# Patient Record
Sex: Female | Born: 1975 | Race: Black or African American | Hispanic: No | Marital: Single | Smoking: Former smoker
Health system: Southern US, Community
[De-identification: ages and names within clinical notes are randomized; demographics above are authoritative.]

## PROBLEM LIST (undated history)

## (undated) DIAGNOSIS — F259 Schizoaffective disorder, unspecified: Secondary | ICD-10-CM

## (undated) DIAGNOSIS — F411 Generalized anxiety disorder: Secondary | ICD-10-CM

## (undated) DIAGNOSIS — I10 Essential (primary) hypertension: Secondary | ICD-10-CM

## (undated) HISTORY — DX: Schizoaffective disorder, unspecified: F25.9

## (undated) HISTORY — PX: KNEE SURGERY: SHX244

## (undated) HISTORY — DX: Generalized anxiety disorder: F41.1

---

## 2013-03-13 ENCOUNTER — Emergency Department (HOSPITAL_COMMUNITY)
Admission: EM | Admit: 2013-03-13 | Discharge: 2013-03-13 | Disposition: A | Discharge status: Home / Self Care | Payer: Self-pay | Attending: Emergency Medicine | Admitting: Emergency Medicine

## 2013-03-13 ENCOUNTER — Encounter (HOSPITAL_COMMUNITY): Payer: Self-pay | Admitting: *Deleted

## 2013-03-13 DIAGNOSIS — R51 Headache: Secondary | ICD-10-CM | POA: Insufficient documentation

## 2013-03-13 DIAGNOSIS — H9209 Otalgia, unspecified ear: Secondary | ICD-10-CM | POA: Insufficient documentation

## 2013-03-13 DIAGNOSIS — J329 Chronic sinusitis, unspecified: Secondary | ICD-10-CM | POA: Insufficient documentation

## 2013-03-13 DIAGNOSIS — R000 Tachycardia, unspecified: Secondary | ICD-10-CM | POA: Insufficient documentation

## 2013-03-13 DIAGNOSIS — J069 Acute upper respiratory infection, unspecified: Secondary | ICD-10-CM | POA: Insufficient documentation

## 2013-03-13 DIAGNOSIS — IMO0001 Reserved for inherently not codable concepts without codable children: Secondary | ICD-10-CM | POA: Insufficient documentation

## 2013-03-13 DIAGNOSIS — J3489 Other specified disorders of nose and nasal sinuses: Secondary | ICD-10-CM | POA: Insufficient documentation

## 2013-03-13 DIAGNOSIS — R509 Fever, unspecified: Secondary | ICD-10-CM | POA: Insufficient documentation

## 2013-03-13 MED ORDER — AMOXICILLIN 500 MG PO CAPS: 500.0000 mg | ORAL_CAPSULE | Freq: Three times a day (TID) | ORAL | Status: DC

## 2013-03-13 MED ORDER — PENICILLIN V POTASSIUM 250 MG PO TABS
500.0000 mg | ORAL_TABLET | Freq: Once | ORAL | Status: AC
  Administered 2013-03-13: 500 mg via ORAL
  Filled 2013-03-13: qty 2

## 2013-03-13 MED ORDER — PSEUDOEPHEDRINE HCL 60 MG PO TABS
60.0000 mg | ORAL_TABLET | Freq: Once | ORAL | Status: AC
  Administered 2013-03-13: 60 mg via ORAL
  Filled 2013-03-13: qty 1

## 2013-03-13 MED ORDER — PROMETHAZINE-CODEINE 6.25-10 MG/5ML PO SYRP: 5.0000 mL | ORAL_SOLUTION | ORAL | Status: DC | PRN

## 2013-03-13 MED ORDER — PSEUDOEPHEDRINE HCL 60 MG PO TABS: 60.0000 mg | ORAL_TABLET | Freq: Three times a day (TID) | ORAL | Status: DC

## 2013-03-13 MED ORDER — HYDROCOD POLST-CHLORPHEN POLST 10-8 MG/5ML PO LQCR
5.0000 mL | Freq: Once | ORAL | Status: AC
  Administered 2013-03-13: 5 mL via ORAL
  Filled 2013-03-13: qty 5

## 2013-03-13 MED ORDER — IBUPROFEN 800 MG PO TABS
800.0000 mg | ORAL_TABLET | Freq: Once | ORAL | Status: AC
  Administered 2013-03-13: 800 mg via ORAL
  Filled 2013-03-13: qty 1

## 2013-03-13 MED ORDER — ONDANSETRON HCL 4 MG PO TABS
4.0000 mg | ORAL_TABLET | Freq: Once | ORAL | Status: AC
  Administered 2013-03-13: 4 mg via ORAL
  Filled 2013-03-13: qty 1

## 2013-03-13 NOTE — ED Notes (Signed)
Pt alert & oriented x4, stable gait. Patient given discharge instructions, paperwork & prescription(s). Patient  instructed to stop at the registration desk to finish any additional paperwork. Patient verbalized understanding. Pt left department w/ no further questions. 

## 2013-03-13 NOTE — ED Provider Notes (Signed)
Medical screening examination/treatment/procedure(s) were performed by non-physician practitioner and as supervising physician I was immediately available for consultation/collaboration.    Benny Lennert, MD 03/13/13 [phone]

## 2013-03-13 NOTE — ED Provider Notes (Signed)
History     CSN: [id number]  Arrival date & time 03/13/13  2055   First MD Initiated Contact with Patient 03/13/13 2139      Chief Complaint  Patient presents with  . Cough  . Nasal Congestion  . Generalized Body Aches  . Otalgia    (Consider location/radiation/quality/duration/timing/severity/associated sxs/prior treatment) Patient is a 37 y.o. female presenting with cough and ear pain. The history is provided by the patient.  Cough Cough characteristics:  Non-productive Severity:  Moderate Onset quality:  Gradual Timing:  Intermittent Progression:  Worsening Relieved by:  Nothing Worsened by:  Nothing tried Ineffective treatments:  None tried Associated symptoms: chills, ear pain, fever, headaches, myalgias and sinus congestion   Associated symptoms: no chest pain, no eye discharge, no shortness of breath and no wheezing   Risk factors: no recent infection and no recent travel   Risk factors comment:  Sick family member Otalgia Associated symptoms: cough, fever and headaches   Associated symptoms: no abdominal pain and no neck pain     History reviewed. No pertinent past medical history.  Past Surgical History  Procedure Laterality Date  . Cesarean section      History reviewed. No pertinent family history.  History  Substance Use Topics  . Smoking status: Never Smoker   . Smokeless tobacco: Not on file  . Alcohol Use: No    OB History   Grav Para Term Preterm Abortions TAB SAB Ect Mult Living                  Review of Systems  Constitutional: Positive for fever and chills. Negative for activity change.       All ROS Neg except as noted in HPI  HENT: Positive for ear pain. Negative for nosebleeds and neck pain.   Eyes: Negative for photophobia and discharge.  Respiratory: Positive for cough. Negative for shortness of breath and wheezing.   Cardiovascular: Negative for chest pain and palpitations.  Gastrointestinal: Negative for abdominal pain and  blood in stool.  Genitourinary: Negative for dysuria, frequency and hematuria.  Musculoskeletal: Positive for myalgias. Negative for back pain and arthralgias.  Skin: Negative.   Neurological: Positive for headaches. Negative for dizziness, seizures and speech difficulty.  Psychiatric/Behavioral: Negative for hallucinations and confusion.    Allergies  Review of patient's allergies indicates no known allergies.  Home Medications   Current Outpatient Rx  Name  Route  Sig  Dispense  Refill  . amoxicillin (AMOXIL) 500 MG capsule   Oral   Take 1 capsule (500 mg total) by mouth 3 (three) times daily.   21 capsule   0   . promethazine-codeine (PHENERGAN WITH CODEINE) 6.25-10 MG/5ML syrup   Oral   Take 5 mLs by mouth every 4 (four) hours as needed for cough.   150 mL   0   . pseudoephedrine (SUDAFED) 60 MG tablet   Oral   Take 1 tablet (60 mg total) by mouth 3 (three) times daily.   30 tablet   0     BP 126/84  Pulse 105  Temp(Src) 99.9 F (37.7 C) (Oral)  Resp 20  Ht 5\' 7"  (1.702 m)  Wt 169 lb (76.658 kg)  BMI 26.46 kg/m2  SpO2 100%  LMP 02/27/2013  Physical Exam  Nursing note and vitals reviewed. Constitutional: She is oriented to person, place, and time. She appears well-developed and well-nourished.  Non-toxic appearance. She appears ill.  HENT:  Head: Normocephalic.  Right Ear: Tympanic membrane  and external ear normal.  Left Ear: Tympanic membrane and external ear normal.  Nasal congestion  Eyes: EOM and lids are normal. Pupils are equal, round, and reactive to light.  Neck: Normal range of motion. Neck supple. Carotid bruit is not present.  Cardiovascular: Regular rhythm, normal heart sounds, intact distal pulses and normal pulses.  Tachycardia present.   Pulmonary/Chest: No respiratory distress.  Course breath sounds. No focal consolidation. Symmetrical rise and fall of the chest.  Abdominal: Soft. Bowel sounds are normal. There is no tenderness. There is  no guarding.  Musculoskeletal: Normal range of motion.  Lymphadenopathy:       Head (right side): No submandibular adenopathy present.       Head (left side): No submandibular adenopathy present.    She has no cervical adenopathy.  Neurological: She is alert and oriented to person, place, and time. She has normal strength. No cranial nerve deficit or sensory deficit.  Skin: Skin is warm and dry. No rash noted.  Psychiatric: She has a normal mood and affect. Her speech is normal.    ED Course  Procedures (including critical care time)  Labs Reviewed - No data to display No results found.   1. Sinusitis   2. URI (upper respiratory infection)       MDM  I have reviewed nursing notes, vital signs, and all appropriate lab and imaging results for this patient.  Suspect URI and sinusitis. Pt treated with amoxil, promethazine cough medication. And sudafed. Temp 99 after tx with ibuprofen. Pt ambulatory without problem. Pulse ox 100% on RA. Pt to return if any changes or problem.      Kathie Dike, PA-C 03/13/13 2249

## 2013-03-13 NOTE — ED Notes (Signed)
Pt c/o cough, congestion, generalized body aches, and right ear pain.

## 2013-03-14 NOTE — ED Notes (Signed)
Pt called 6/9 requesting a work note.  Copy made and left at registration

## 2014-10-05 ENCOUNTER — Emergency Department (HOSPITAL_COMMUNITY): Payer: Medicaid Other

## 2014-10-05 ENCOUNTER — Encounter (HOSPITAL_COMMUNITY): Payer: Self-pay | Admitting: Cardiology

## 2014-10-05 ENCOUNTER — Emergency Department (HOSPITAL_COMMUNITY)
Admission: EM | Admit: 2014-10-05 | Discharge: 2014-10-05 | Disposition: A | Discharge status: Home / Self Care | Payer: Medicaid Other | Attending: Emergency Medicine | Admitting: Emergency Medicine

## 2014-10-05 DIAGNOSIS — M25562 Pain in left knee: Secondary | ICD-10-CM | POA: Diagnosis not present

## 2014-10-05 DIAGNOSIS — M79672 Pain in left foot: Secondary | ICD-10-CM | POA: Diagnosis not present

## 2014-10-05 DIAGNOSIS — M79605 Pain in left leg: Secondary | ICD-10-CM | POA: Diagnosis present

## 2014-10-05 DIAGNOSIS — Z792 Long term (current) use of antibiotics: Secondary | ICD-10-CM | POA: Insufficient documentation

## 2014-10-05 DIAGNOSIS — M79662 Pain in left lower leg: Secondary | ICD-10-CM | POA: Insufficient documentation

## 2014-10-05 DIAGNOSIS — Z791 Long term (current) use of non-steroidal anti-inflammatories (NSAID): Secondary | ICD-10-CM | POA: Diagnosis not present

## 2014-10-05 DIAGNOSIS — M79673 Pain in unspecified foot: Secondary | ICD-10-CM

## 2014-10-05 DIAGNOSIS — Z79899 Other long term (current) drug therapy: Secondary | ICD-10-CM | POA: Insufficient documentation

## 2014-10-05 DIAGNOSIS — M25569 Pain in unspecified knee: Secondary | ICD-10-CM

## 2014-10-05 DIAGNOSIS — R531 Weakness: Secondary | ICD-10-CM | POA: Insufficient documentation

## 2014-10-05 MED ORDER — IBUPROFEN 800 MG PO TABS: 800.0000 mg | ORAL_TABLET | Freq: Three times a day (TID) | ORAL | Status: DC

## 2014-10-05 MED ORDER — IBUPROFEN 800 MG PO TABS
800.0000 mg | ORAL_TABLET | Freq: Once | ORAL | Status: AC
  Administered 2014-10-05: 800 mg via ORAL
  Filled 2014-10-05: qty 1

## 2014-10-05 MED ORDER — HYDROCODONE-ACETAMINOPHEN 5-325 MG PO TABS: 2.0000 | ORAL_TABLET | ORAL | Status: DC | PRN

## 2014-10-05 MED ORDER — HYDROCODONE-ACETAMINOPHEN 5-325 MG PO TABS
1.0000 | ORAL_TABLET | Freq: Once | ORAL | Status: AC
  Administered 2014-10-05: 1 via ORAL
  Filled 2014-10-05: qty 1

## 2014-10-05 NOTE — ED Notes (Signed)
Left leg pain  since 12 mn.  Denies any injury.

## 2014-10-05 NOTE — ED Provider Notes (Signed)
CSN: [id number]     Arrival date & time 10/05/14  0705 History  This chart was scribed for Glynn Octave, MD by Abel Presto, ED Scribe. This patient was seen in room APA03/[facility]03 and the patient's care was started at 7:13 AM.    Chief Complaint  Patient presents with  . Leg Pain     Patient is a 38 y.o. female presenting with leg pain. The history is provided by the patient. No language interpreter was used.  Leg Pain Associated symptoms: no back pain and no fever     HPI Comments: HPI Comments: Paula Medina is a 38 y.o. female brought in by ambulance, who presents to the Emergency Department complaining of constant left knee pain with onset at 12:30 AM.  Pt notes she has been having this pain intermittently for a month but it worsened this morning, radiating from her knee to her foot.  Pt was sleeping at first onset.  Pt notes associated swelling, weakness, and numbness in her toes.  She denies pain in her thigh, back, or abdomen. Pt has used ibuprofen, epsom salt soak, and icing for relief. Pt denies PMHx of DM and IVDA. Pt notes she saw her PCP for the symptoms and was told she has fluid build-up in her legs. Pt denies any fall, injuries, back pain, and fever. Her PCP is Dr.Tapper.  History reviewed. No pertinent past medical history. Past Surgical History  Procedure Laterality Date  . Cesarean section     History reviewed. No pertinent family history. History  Substance Use Topics  . Smoking status: Never Smoker   . Smokeless tobacco: Not on file  . Alcohol Use: No   OB History    No data available     Review of Systems  Constitutional: Negative for fever.  Gastrointestinal: Negative for abdominal pain.  Musculoskeletal: Positive for myalgias. Negative for back pain.  Neurological: Positive for weakness and numbness.    A complete 10 system review of systems was obtained and all systems are negative except as noted in the HPI and PMH.      Allergies  Review of  patient's allergies indicates no known allergies.  Home Medications   Prior to Admission medications   Medication Sig Start Date End Date Taking? Authorizing Provider  amoxicillin (AMOXIL) 500 MG capsule Take 1 capsule (500 mg total) by mouth 3 (three) times daily. 03/13/13   Kathie Dike, PA-C  HYDROcodone-acetaminophen (NORCO/VICODIN) 5-325 MG per tablet Take 2 tablets by mouth every 4 (four) hours as needed. 10/05/14   Glynn Octave, MD  ibuprofen (ADVIL,MOTRIN) 800 MG tablet Take 1 tablet (800 mg total) by mouth 3 (three) times daily. 10/05/14   Glynn Octave, MD  promethazine-codeine (PHENERGAN WITH CODEINE) 6.25-10 MG/5ML syrup Take 5 mLs by mouth every 4 (four) hours as needed for cough. 03/13/13   Kathie Dike, PA-C  pseudoephedrine (SUDAFED) 60 MG tablet Take 1 tablet (60 mg total) by mouth 3 (three) times daily. 03/13/13   Kathie Dike, PA-C   BP 130/95 mmHg  Pulse 85  Temp(Src) 97.8 F (36.6 C) (Oral)  Resp 20  Ht 5\' 6"  (1.676 m)  Wt 180 lb (81.647 kg)  BMI 29.07 kg/m2  SpO2 100%  LMP 09/21/2014 Physical Exam  Constitutional: She is oriented to person, place, and time. She appears well-developed and well-nourished. No distress.  HENT:  Head: Normocephalic and atraumatic.  Mouth/Throat: Oropharynx is clear and moist. No oropharyngeal exudate.  Eyes: Conjunctivae and EOM are normal.  Pupils are equal, round, and reactive to light.  Neck: Normal range of motion. Neck supple.  No meningismus.  Cardiovascular: Normal rate, regular rhythm, normal heart sounds and intact distal pulses.   No murmur heard. Left knee: Intact DP and PT pulses  Pulmonary/Chest: Effort normal and breath sounds normal. No respiratory distress.  Abdominal: Soft. There is no tenderness. There is no rebound and no guarding.  Musculoskeletal: Normal range of motion. She exhibits no edema.       Left knee: She exhibits no effusion, no erythema, no LCL laxity and no MCL laxity. Tenderness (calf)  found.  flexion and extension intact  Neurological: She is alert and oriented to person, place, and time. No cranial nerve deficit. She exhibits normal muscle tone. Coordination normal.  No ataxia on finger to nose bilaterally. No pronator drift. 5/5 strength throughout. CN 2-12 intact. Negative Romberg. Equal grip strength. Sensation intact. Gait is normal.   Skin: Skin is warm.  Psychiatric: She has a normal mood and affect. Her behavior is normal.  Nursing note and vitals reviewed.   ED Course  Procedures (including critical care time) DIAGNOSTIC STUDIES: Oxygen Saturation is 100% on room air, normal by my interpretation.    COORDINATION OF CARE: 7:19 AM Discussed treatment plan with patient at beside, the patient agrees with the plan and has no further questions at this time.   Labs Review Labs Reviewed - No data to display  Imaging Review [place] Venous Img Lower Unilateral Left  10/05/2014   CLINICAL DATA:  Pain in region of left knee  EXAM: LEFT LOWER EXTREMITY VENOUS DUPLEX ULTRASOUND  TECHNIQUE: Gray-scale sonography with graded compression, as well as color Doppler and duplex ultrasound were performed to evaluate the left lower extremity deep venous system from the level of the common femoral vein and including the common femoral, femoral, profunda femoral, popliteal and calf veins including the posterior tibial, peroneal and gastrocnemius veins when visible. The superficial great saphenous vein was also interrogated. Spectral Doppler was utilized to evaluate flow at rest and with distal augmentation maneuvers in the common femoral, femoral and popliteal veins.  COMPARISON:  None.  FINDINGS: Contralateral Common Femoral Vein: Respiratory phasicity is normal and symmetric with the symptomatic side. No evidence of thrombus. Normal compressibility.  Common Femoral Vein: No evidence of thrombus. Normal compressibility, respiratory phasicity and response to augmentation.  Saphenofemoral  Junction: No evidence of thrombus. Normal compressibility and flow on color Doppler imaging.  Profunda Femoral Vein: No evidence of thrombus. Normal compressibility and flow on color Doppler imaging.  Femoral Vein: No evidence of thrombus. Normal compressibility, respiratory phasicity and response to augmentation.  Popliteal Vein: No evidence of thrombus. Normal compressibility, respiratory phasicity and response to augmentation.  Calf Veins: No evidence of thrombus. Normal compressibility and flow on color Doppler imaging.  Superficial Great Saphenous Vein: No evidence of thrombus. Normal compressibility and flow on color Doppler imaging.  Venous Reflux:  None.  Other Findings:  None.  IMPRESSION: No evidence of left lower extremity deep venous thrombosis. The right common femoral vein is also patent.   Electronically Signed   By: Bretta BangWilliam  Woodruff M.D.   On: 10/05/2014 09:04   Dg Knee Complete 4 Views Left  10/05/2014   CLINICAL DATA:  Intermittent knee pain for the past month worsening this morning.  EXAM: LEFT KNEE - COMPLETE 4+ VIEW  COMPARISON:  None.  FINDINGS: The joint spaces are maintained. Minimal lateral tibial spurring changes. No acute fracture or osteochondral abnormality. No  obvious joint effusion.  IMPRESSION: No acute bony findings.   Electronically Signed   By: Loralie ChampagneMark  Gallerani M.D.   On: 10/05/2014 07:52   Dg Foot Complete Left  10/05/2014   CLINICAL DATA:  Generalized left foot pain since last night without known injury  EXAM: LEFT FOOT - COMPLETE 3+ VIEW  COMPARISON:  None.  FINDINGS: The bones of the left foot are adequately mineralized. There are mild degenerative interphalangeal joint changes. The metatarsophalangeal joints are unremarkable. The intertarsal and tarsometatarsal joints also are unremarkable. The soft tissues exhibit no abnormal calcifications nor evidence of gas collections or significant swelling.  IMPRESSION: There is no acute bony abnormality of the left foot.    Electronically Signed   By: David  SwazilandJordan   On: 10/05/2014 09:51     EKG Interpretation None      MDM   Final diagnoses:  Knee pain  Foot pain   knee and leg pain for the past month, worse this morning. Denies injury. Flexion and extension intact neurovascularly intact  Some calf tenderness so will obtain Doppler. Knee Xray negative, Doppler negative Pain improved with meds. Able to flex and extend knee, able to ambulate. States foot hurts with weight bearing.  Xray negative as well.  Treat supportively with symptom control, follow-up with PCP.  I personally performed the services described in this documentation, which was scribed in my presence. The recorded information has been reviewed and is accurate.     Glynn OctaveStephen Ramonica Grigg, MD 10/05/14 [id number]

## 2014-10-05 NOTE — ED Notes (Signed)
Tried to ambulate pt around nurses station, pt is using a walker for assistance, but states that she cannot put pressure on her left food, because that would hurt too much in her knee. She tried to use the walker by holding on to it and hop with her right leg.

## 2014-10-05 NOTE — Discharge Instructions (Signed)
Arthralgia There is no evidence of broken bone or blood clot. Follow up with your doctor. Return to the ED if you develop new or worsening symptoms. Your caregiver has diagnosed you as suffering from an arthralgia. Arthralgia means there is pain in a joint. This can come from many reasons including:  Bruising the joint which causes soreness (inflammation) in the joint.  Wear and tear on the joints which occur as we grow older (osteoarthritis).  Overusing the joint.  Various forms of arthritis.  Infections of the joint. Regardless of the cause of pain in your joint, most of these different pains respond to anti-inflammatory drugs and rest. The exception to this is when a joint is infected, and these cases are treated with antibiotics, if it is a bacterial infection. HOME CARE INSTRUCTIONS   Rest the injured area for as long as directed by your caregiver. Then slowly start using the joint as directed by your caregiver and as the pain allows. Crutches as directed may be useful if the ankles, knees or hips are involved. If the knee was splinted or casted, continue use and care as directed. If an stretchy or elastic wrapping bandage has been applied today, it should be removed and re-applied every 3 to 4 hours. It should not be applied tightly, but firmly enough to keep swelling down. Watch toes and feet for swelling, bluish discoloration, coldness, numbness or excessive pain. If any of these problems (symptoms) occur, remove the ace bandage and re-apply more loosely. If these symptoms persist, contact your caregiver or return to this location.  For the first 24 hours, keep the injured extremity elevated on pillows while lying down.  Apply ice for 15-20 minutes to the sore joint every couple hours while awake for the first half day. Then 03-04 times per day for the first 48 hours. Put the ice in a plastic bag and place a towel between the bag of ice and your skin.  Wear any splinting, casting,  elastic bandage applications, or slings as instructed.  Only take over-the-counter or prescription medicines for pain, discomfort, or fever as directed by your caregiver. Do not use aspirin immediately after the injury unless instructed by your physician. Aspirin can cause increased bleeding and bruising of the tissues.  If you were given crutches, continue to use them as instructed and do not resume weight bearing on the sore joint until instructed. Persistent pain and inability to use the sore joint as directed for more than 2 to 3 days are warning signs indicating that you should see a caregiver for a follow-up visit as soon as possible. Initially, a hairline fracture (break in bone) may not be evident on X-rays. Persistent pain and swelling indicate that further evaluation, non-weight bearing or use of the joint (use of crutches or slings as instructed), or further X-rays are indicated. X-rays may sometimes not show a small fracture until a week or 10 days later. Make a follow-up appointment with your own caregiver or one to whom we have referred you. A radiologist (specialist in reading X-rays) may read your X-rays. Make sure you know how you are to obtain your X-ray results. Do not assume everything is normal if you do not hear from us. SEEK MEDICAL CARE IF: Bruising, swelling, or pain increases. SEEK IMMEDIATE MEDICAL CARE IF:   Your fingers or toes are numb or blue.  The pain is not responding to medications and continues to stay the same or get worse.  The pain in your joint  becomes severe.  You develop a fever over 102 F (38.9 C).  It becomes impossible to move or use the joint. MAKE SURE YOU:   Understand these instructions.  Will watch your condition.  Will get help right away if you are not doing well or get worse. Document Released: 09/22/2005 Document Revised: 12/15/2011 Document Reviewed: 05/10/2008 [facility] Patient Information 2015 [place], [place]. This information is  not intended to replace advice given to you by your health care provider. Make sure you discuss any questions you have with your health care provider.

## 2014-10-05 NOTE — ED Notes (Signed)
MD at bedside. 

## 2014-10-17 ENCOUNTER — Other Ambulatory Visit (HOSPITAL_COMMUNITY): Payer: Self-pay | Admitting: Orthopedic Surgery

## 2014-10-17 DIAGNOSIS — M25462 Effusion, left knee: Secondary | ICD-10-CM

## 2014-10-17 DIAGNOSIS — M25562 Pain in left knee: Secondary | ICD-10-CM

## 2014-10-17 DIAGNOSIS — M2392 Unspecified internal derangement of left knee: Secondary | ICD-10-CM

## 2014-10-25 ENCOUNTER — Ambulatory Visit (HOSPITAL_COMMUNITY)
Admission: RE | Admit: 2014-10-25 | Discharge: 2014-10-25 | Disposition: A | Discharge status: Home / Self Care | Payer: Medicaid Other | Source: Ambulatory Visit | Attending: Orthopedic Surgery | Admitting: Orthopedic Surgery

## 2014-10-25 DIAGNOSIS — M25562 Pain in left knee: Secondary | ICD-10-CM | POA: Diagnosis present

## 2014-10-25 DIAGNOSIS — M23001 Cystic meniscus, unspecified lateral meniscus, left knee: Secondary | ICD-10-CM | POA: Insufficient documentation

## 2014-10-25 DIAGNOSIS — M2392 Unspecified internal derangement of left knee: Secondary | ICD-10-CM

## 2014-10-25 DIAGNOSIS — M2242 Chondromalacia patellae, left knee: Secondary | ICD-10-CM | POA: Diagnosis not present

## 2014-10-25 DIAGNOSIS — M25462 Effusion, left knee: Secondary | ICD-10-CM

## 2015-01-25 ENCOUNTER — Emergency Department (HOSPITAL_COMMUNITY)
Admission: EM | Admit: 2015-01-25 | Discharge: 2015-01-25 | Disposition: A | Discharge status: Home / Self Care | Payer: Medicaid Other | Attending: Emergency Medicine | Admitting: Emergency Medicine

## 2015-01-25 ENCOUNTER — Encounter (HOSPITAL_COMMUNITY): Payer: Self-pay

## 2015-01-25 DIAGNOSIS — L03032 Cellulitis of left toe: Secondary | ICD-10-CM | POA: Diagnosis not present

## 2015-01-25 DIAGNOSIS — Z791 Long term (current) use of non-steroidal anti-inflammatories (NSAID): Secondary | ICD-10-CM | POA: Insufficient documentation

## 2015-01-25 DIAGNOSIS — Z792 Long term (current) use of antibiotics: Secondary | ICD-10-CM | POA: Diagnosis not present

## 2015-01-25 DIAGNOSIS — L089 Local infection of the skin and subcutaneous tissue, unspecified: Secondary | ICD-10-CM | POA: Diagnosis present

## 2015-01-25 DIAGNOSIS — Z79899 Other long term (current) drug therapy: Secondary | ICD-10-CM | POA: Insufficient documentation

## 2015-01-25 DIAGNOSIS — L03012 Cellulitis of left finger: Secondary | ICD-10-CM

## 2015-01-25 MED ORDER — OXYCODONE-ACETAMINOPHEN 5-325 MG PO TABS: 1.0000 | ORAL_TABLET | ORAL | Status: DC | PRN

## 2015-01-25 MED ORDER — POVIDONE-IODINE 10 % EX SOLN
CUTANEOUS | Status: AC
  Administered 2015-01-25: 13:00:00
  Filled 2015-01-25: qty 118

## 2015-01-25 MED ORDER — LIDOCAINE HCL (PF) 2 % IJ SOLN
INTRAMUSCULAR | Status: AC
  Administered 2015-01-25: 13:00:00
  Filled 2015-01-25: qty 10

## 2015-01-25 MED ORDER — SULFAMETHOXAZOLE-TRIMETHOPRIM 800-160 MG PO TABS: 1.0000 | ORAL_TABLET | Freq: Two times a day (BID) | ORAL | Status: DC

## 2015-01-25 NOTE — Discharge Instructions (Signed)

## 2015-01-25 NOTE — ED Notes (Signed)
Pt reports pain and swelling of left great toe since Sunday.  [facility] and [facility] put on antibiotic and pain medication.  Pt says swelling is getting worse.

## 2015-01-25 NOTE — ED Provider Notes (Signed)
CSN: [id number]     Arrival date & time 01/25/15  1135 History   First MD Initiated Contact with Patient 01/25/15 1201     Chief Complaint  Patient presents with  . toe infection      (Consider location/radiation/quality/duration/timing/severity/associated sxs/prior Treatment) The history is provided by the patient.   Paula Medina is a 39 y.o. female presenting with pain and swelling of her left great toe which started 5 days ago when she stubbed it into her bedpost.  She was seen at [facility] at which time she reports xrays were negative and she was placed on keflex for treatment of an early infection which has worsened despite the antibiotics and warm soaks.  She denies fever or chills, nausea, vomiting or radiation of pain.    History reviewed. No pertinent past medical history. Past Surgical History  Procedure Laterality Date  . Cesarean section    . Knee surgery     No family history on file. History  Substance Use Topics  . Smoking status: Never Smoker   . Smokeless tobacco: Not on file  . Alcohol Use: No   OB History    No data available     Review of Systems  Constitutional: Negative for fever and chills.  Respiratory: Negative for shortness of breath and wheezing.   Musculoskeletal: Positive for arthralgias.  Skin: Positive for color change.  Neurological: Negative for numbness.      Allergies  Review of patient's allergies indicates no known allergies.  Home Medications   Prior to Admission medications   Medication Sig Start Date End Date Taking? Authorizing Provider  acetaminophen (TYLENOL) 500 MG tablet Take 1,000-1,500 mg by mouth every 6 (six) hours as needed for moderate pain.   Yes Historical Provider, MD  cephALEXin (KEFLEX) 250 MG capsule Take 250 mg by mouth 3 (three) times daily.   Yes Historical Provider, MD  amoxicillin (AMOXIL) 500 MG capsule Take 1 capsule (500 mg total) by mouth 3 (three) times daily. Patient not taking: Reported on  01/25/2015 03/13/13   Ivery Quale, PA-C  HYDROcodone-acetaminophen (NORCO/VICODIN) 5-325 MG per tablet Take 2 tablets by mouth every 4 (four) hours as needed. Patient not taking: Reported on 01/25/2015 10/05/14   Glynn Octave, MD  ibuprofen (ADVIL,MOTRIN) 800 MG tablet Take 1 tablet (800 mg total) by mouth 3 (three) times daily. Patient not taking: Reported on 01/25/2015 10/05/14   Glynn Octave, MD  oxyCODONE-acetaminophen (PERCOCET/ROXICET) 5-325 MG per tablet Take 1 tablet by mouth every 4 (four) hours as needed. 01/25/15   Burgess Amor, PA-C  promethazine-codeine (PHENERGAN WITH CODEINE) 6.25-10 MG/5ML syrup Take 5 mLs by mouth every 4 (four) hours as needed for cough. Patient not taking: Reported on 01/25/2015 03/13/13   Ivery Quale, PA-C  pseudoephedrine (SUDAFED) 60 MG tablet Take 1 tablet (60 mg total) by mouth 3 (three) times daily. Patient not taking: Reported on 01/25/2015 03/13/13   Ivery Quale, PA-C  sulfamethoxazole-trimethoprim (BACTRIM DS,SEPTRA DS) 800-160 MG per tablet Take 1 tablet by mouth 2 (two) times daily. 01/25/15   Burgess Amor, PA-C   BP 130/98 mmHg  Pulse 87  Temp(Src) 98.4 F (36.9 C) (Oral)  Resp 16  Ht  (1.702 m)  Wt 170 lb (77.111 kg)  BMI 26.62 kg/m2  SpO2 100%  LMP 01/18/2015 Physical Exam  Constitutional: She is oriented to person, place, and time. She appears well-developed and well-nourished.  HENT:  Head: Normocephalic.  Cardiovascular: Normal rate.   Pulmonary/Chest: Effort normal.  Musculoskeletal: She  exhibits tenderness.  Left distal great toe ttp, edema, erythema with large paronychia at base of nail. Distal tuft is soft.  Neurological: She is alert and oriented to person, place, and time. No sensory deficit.  Skin: Laceration noted.    ED Course  Procedures (including critical care time)  INCISION AND DRAINAGE Performed by: Burgess AmorIDOL, Acey Woodfield Consent: Verbal consent obtained. Risks and benefits: risks, benefits and alternatives were  discussed Type: abscess  Body area: left great toe   Anesthesia: local infiltration  Incision was made with a scalpel.  Local anesthetic: lidocaine 1% without epinephrine  Anesthetic total: 1 ml  Complexity: complex Blunt dissection to break up loculations, flushed copiously with saline  Drainage: purulent  Drainage amount: moderate  Packing material: none Patient tolerance: Patient tolerated the procedure well with no immediate complications.    Labs Review Labs Reviewed - No data to display  Imaging Review No results found.   EKG Interpretation None      MDM   Final diagnoses:  Paronychia, left    Warm epsom salt soaks, pt to finish keflex, added bactrim.  Prn f/u with pcp if sx are not responding to todays tx.      Burgess AmorJulie Delsie Amador, PA-C 01/25/15 1744  Vanetta MuldersScott Zackowski, MD 01/26/15 [phone]

## 2015-02-16 ENCOUNTER — Other Ambulatory Visit (HOSPITAL_COMMUNITY): Payer: Self-pay | Admitting: Orthopedic Surgery

## 2015-02-16 DIAGNOSIS — M25561 Pain in right knee: Secondary | ICD-10-CM

## 2015-02-28 ENCOUNTER — Ambulatory Visit (HOSPITAL_COMMUNITY): Payer: Medicaid Other

## 2015-03-14 ENCOUNTER — Ambulatory Visit (HOSPITAL_COMMUNITY)
Admission: RE | Admit: 2015-03-14 | Discharge: 2015-03-14 | Disposition: A | Discharge status: Home / Self Care | Payer: Medicaid Other | Source: Ambulatory Visit | Attending: Orthopedic Surgery | Admitting: Orthopedic Surgery

## 2015-03-14 DIAGNOSIS — M25561 Pain in right knee: Secondary | ICD-10-CM | POA: Diagnosis present

## 2015-12-30 ENCOUNTER — Emergency Department (HOSPITAL_COMMUNITY): Payer: Medicaid Other

## 2015-12-30 ENCOUNTER — Emergency Department (HOSPITAL_COMMUNITY)
Admission: EM | Admit: 2015-12-30 | Discharge: 2015-12-30 | Disposition: A | Discharge status: Home / Self Care | Payer: Medicaid Other | Attending: Emergency Medicine | Admitting: Emergency Medicine

## 2015-12-30 ENCOUNTER — Encounter (HOSPITAL_COMMUNITY): Payer: Self-pay | Admitting: Emergency Medicine

## 2015-12-30 DIAGNOSIS — R05 Cough: Secondary | ICD-10-CM

## 2015-12-30 DIAGNOSIS — Z79899 Other long term (current) drug therapy: Secondary | ICD-10-CM | POA: Insufficient documentation

## 2015-12-30 DIAGNOSIS — J0110 Acute frontal sinusitis, unspecified: Secondary | ICD-10-CM | POA: Diagnosis not present

## 2015-12-30 DIAGNOSIS — R059 Cough, unspecified: Secondary | ICD-10-CM

## 2015-12-30 MED ORDER — CETIRIZINE-PSEUDOEPHEDRINE ER 5-120 MG PO TB12: 1.0000 | ORAL_TABLET | Freq: Two times a day (BID) | ORAL | Status: DC

## 2015-12-30 MED ORDER — HYDROCOD POLST-CPM POLST ER 10-8 MG/5ML PO SUER
5.0000 mL | Freq: Once | ORAL | Status: AC
  Administered 2015-12-30: 5 mL via ORAL
  Filled 2015-12-30: qty 5

## 2015-12-30 MED ORDER — PROMETHAZINE-CODEINE 6.25-10 MG/5ML PO SYRP: 5.0000 mL | ORAL_SOLUTION | Freq: Once | ORAL | Status: DC

## 2015-12-30 MED ORDER — AMOXICILLIN 500 MG PO CAPS: 500.0000 mg | ORAL_CAPSULE | Freq: Three times a day (TID) | ORAL | Status: AC

## 2015-12-30 MED ORDER — BENZONATATE 100 MG PO CAPS: 200.0000 mg | ORAL_CAPSULE | Freq: Three times a day (TID) | ORAL | Status: DC | PRN

## 2015-12-30 MED ORDER — AMOXICILLIN 250 MG PO CAPS
500.0000 mg | ORAL_CAPSULE | Freq: Once | ORAL | Status: AC
Start: 2015-12-30 — End: 2015-12-30
  Administered 2015-12-30: 500 mg via ORAL
  Filled 2015-12-30: qty 2

## 2015-12-30 NOTE — ED Provider Notes (Signed)
CSN: [id number]     Arrival date & time 12/30/15  1100 History  By signing my name below, I, Paula Medina, attest that this documentation has been prepared under the direction and in the presence of Burgess Amor, PA-C. Electronically Signed: Doreatha Medina, ED Scribe. 12/30/2015. 11:51 AM.    Chief Complaint  Patient presents with  . Cough   The history is provided by the patient. No language interpreter was used.   HPI Comments: Paula Medina is a 40 y.o. female who presents to the Emergency Department complaining of moderate, intermittent productive cough with green sputum onset a week ago with associated congestion, rhinorrhea, fever (Tmax 103), HA, sinus tenderness, right ear pain, post-nasal drip with burning pain mid chest with coughing.  She also complains of generalized achiness for 2 days. She also reports nausea, wheezing and 3 episodes of emesis at the beginning of the week that has now resolved. Pt notes that her sputum is green when she blows her nose as well. No h/o sinus infection. Pt states she has taken ibuprofen and aleve with temporary relief of fever. She states no relief of symptoms with mucinex or theraflu. No known sick contacts with similar symptoms. NKDA. She denies dysuria, frequency, diarrhea, current nausea, emesis or current wheezing.   History reviewed. No pertinent past medical history. Past Surgical History  Procedure Laterality Date  . Cesarean section    . Knee surgery     History reviewed. No pertinent family history. Social History  Substance Use Topics  . Smoking status: Never Smoker   . Smokeless tobacco: None  . Alcohol Use: No   OB History    No data available     Review of Systems  Constitutional: Positive for fever.  HENT: Positive for congestion, dental problem, ear pain, postnasal drip, rhinorrhea and sinus pressure.   Respiratory: Positive for cough.   Cardiovascular: Positive for chest pain (d/t cough).  Gastrointestinal: Negative for  nausea, vomiting, abdominal pain and diarrhea.  Genitourinary: Negative for dysuria and frequency.  Musculoskeletal: Positive for myalgias and back pain.  Neurological: Positive for headaches.   Allergies  Review of patient's allergies indicates no known allergies.  Home Medications   Prior to Admission medications   Medication Sig Start Date End Date Taking? Authorizing Provider  ALPRAZolam Prudy Feeler) 0.5 MG tablet Take 1 tablet by mouth 2 (two) times daily as needed for anxiety or sleep.  12/03/15  Yes Historical Provider, MD  guaiFENesin (MUCINEX) 600 MG 12 hr tablet Take 1,200 mg by mouth 2 (two) times daily as needed for cough or to loosen phlegm.   Yes Historical Provider, MD  naproxen sodium (ALEVE) 220 MG tablet Take 220 mg by mouth 2 (two) times daily as needed (pain/fever).   Yes Historical Provider, MD  Phenylephrine-Pheniramine-DM [facility] COLD & COUGH PO) Take 30 mLs by mouth every 6 (six) hours as needed (cold/cough).   Yes Historical Provider, MD  amoxicillin (AMOXIL) 500 MG capsule Take 1 capsule (500 mg total) by mouth 3 (three) times daily. 12/30/15 01/09/16  Burgess Amor, PA-C  benzonatate (TESSALON) 100 MG capsule Take 2 capsules (200 mg total) by mouth 3 (three) times daily as needed. 12/30/15   Burgess Amor, PA-C  cetirizine-pseudoephedrine (ZYRTEC-D) 5-120 MG tablet Take 1 tablet by mouth 2 (two) times daily. 12/30/15   Burgess Amor, PA-C   BP 133/81 mmHg  Pulse 113  Temp(Src) 99.7 F (37.6 C) (Oral)  Resp 20  Ht  (1.702 m)  Wt 65.318 kg  BMI 22.55 kg/m2  SpO2 100%  LMP 12/24/2015 Physical Exam  Constitutional: She is oriented to person, place, and time. She appears well-developed and well-nourished.  HENT:  Head: Normocephalic and atraumatic.  Right Ear: Tympanic membrane normal.  Left Ear: Tympanic membrane normal.  Nose: Right sinus exhibits frontal sinus tenderness. Left sinus exhibits frontal sinus tenderness.  She is mildly tender over bilateral frontal  sinuses. Positive nasal congestion.   Eyes: Conjunctivae and EOM are normal. Pupils are equal, round, and reactive to light.  Neck: Normal range of motion. Neck supple.  Cardiovascular: Normal rate, regular rhythm and normal heart sounds.  Exam reveals no gallop and no friction rub.   No murmur heard. Pulmonary/Chest: Effort normal. No respiratory distress. She has no wheezes. She has no rales. She exhibits no tenderness.  Faint crackle at left base which clears with cough.   Abdominal: Soft. Bowel sounds are normal. She exhibits no distension and no mass. There is no tenderness. There is no rebound and no guarding.  Musculoskeletal: Normal range of motion.  Neurological: She is alert and oriented to person, place, and time.  Skin: Skin is warm and dry.  Psychiatric: She has a normal mood and affect. Her behavior is normal.  Nursing note and vitals reviewed.   ED Course  Procedures (including critical care time) DIAGNOSTIC STUDIES: Oxygen Saturation is 100% on RA, normal by my interpretation.    COORDINATION OF CARE: 11:47 AM Discussed treatment plan with pt at bedside which includes CXR, symptomatic therapy and pt agreed to plan.   Imaging Review Dg Chest 2 View  12/30/2015  CLINICAL DATA:  Nonproductive cough, fever, chills, and body aches. EXAM: CHEST  2 VIEW COMPARISON:  None. FINDINGS: The heart size and mediastinal contours are within normal limits. Both lungs are clear. The visualized skeletal structures are unremarkable. IMPRESSION: No active cardiopulmonary disease. Electronically Signed   By: Elsie StainJohn T Curnes M.D.   On: 12/30/2015 12:53   I have personally reviewed and evaluated these images as part of my medical decision-making.  MDM   Final diagnoses:  Acute frontal sinusitis, recurrence not specified  Cough    Radiological studies were viewed, interpreted and considered during the medical decision making and disposition process. I agree with radiologists reading.  Results  were also discussed with patient.  Placed on amoxil for exam c/w acute frontal sinusitis. Prescribed zyrtec d, tessalon for cough. Advised moist heat, warm compresses to sinus area. Menthol drops, rest and increased fluid intake. Advised f/u with pcp if sx persist or worsen.  The patient appears reasonably screened and/or stabilized for discharge and I doubt any other medical condition or other [facility] requiring further screening, evaluation, or treatment in the ED at this time prior to discharge.   I personally performed the services described in this documentation, which was scribed in my presence. The recorded information has been reviewed and is accurate.   Burgess AmorJulie De Libman, PA-C 12/30/15 1710  Azalia BilisKevin Campos, MD 01/01/16 1255

## 2015-12-30 NOTE — Discharge Instructions (Signed)

## 2015-12-30 NOTE — ED Notes (Signed)
Pt states she has been coughing and running a fever for about 4 days.  Also c/o generalized aching.

## 2016-03-04 ENCOUNTER — Emergency Department (HOSPITAL_COMMUNITY)
Admission: EM | Admit: 2016-03-04 | Discharge: 2016-03-04 | Disposition: A | Discharge status: Home / Self Care | Payer: Medicaid Other | Attending: Emergency Medicine | Admitting: Emergency Medicine

## 2016-03-04 ENCOUNTER — Encounter (HOSPITAL_COMMUNITY): Payer: Self-pay | Admitting: Emergency Medicine

## 2016-03-04 DIAGNOSIS — M25562 Pain in left knee: Secondary | ICD-10-CM | POA: Diagnosis present

## 2016-03-04 MED ORDER — NAPROXEN 500 MG PO TABS: 500.0000 mg | ORAL_TABLET | Freq: Two times a day (BID) | ORAL | Status: DC

## 2016-03-04 MED ORDER — METHOCARBAMOL 500 MG PO TABS
1000.0000 mg | ORAL_TABLET | Freq: Once | ORAL | Status: AC
  Administered 2016-03-04: 1000 mg via ORAL
  Filled 2016-03-04: qty 2

## 2016-03-04 MED ORDER — NAPROXEN 250 MG PO TABS
500.0000 mg | ORAL_TABLET | Freq: Once | ORAL | Status: AC
  Administered 2016-03-04: 500 mg via ORAL
  Filled 2016-03-04: qty 2

## 2016-03-04 MED ORDER — METHOCARBAMOL 500 MG PO TABS: 500.0000 mg | ORAL_TABLET | Freq: Two times a day (BID) | ORAL | Status: DC

## 2016-03-04 NOTE — Discharge Instructions (Signed)
Ms. Paula Medina,  Nice meeting you! Please follow-up with your primary care provider within 1-2 weeks if your pain continues. Return to the emergency department if you develop fevers, chills, increased pain, chest pain, shortness of breath, leg discolorations, numbness/tingling, inability to walk, new/worsening symptoms. Feel better soon!  S. Lane HackerNicole Demontray Franta, PA-C

## 2016-03-04 NOTE — ED Provider Notes (Signed)
CSN: [id number]     Arrival date & time 03/04/16  1620 History   First MD Initiated Contact with Patient 03/04/16 1641     Chief Complaint  Patient presents with  . Leg Pain   HPI   Paula Medina is a 40 y.o. female with no significant PMH presenting with acute on chronic left knee pain. She describes the pain as left knee in location, radiating down to her foot, constant, worse with walking, 10/10 pain scale, not alleviated by Aleve. She denies fevers, chills, nausea, vomiting, chest pain, shortness of breath, abdominal pain, recent surgeries, trauma.  History reviewed. No pertinent past medical history. Past Surgical History  Procedure Laterality Date  . Cesarean section    . Knee surgery     No family history on file. Social History  Substance Use Topics  . Smoking status: Never Smoker   . Smokeless tobacco: Never Used  . Alcohol Use: No   OB History    Gravida Para Term Preterm AB TAB SAB Ectopic Multiple Living   3 2 2  1  1         Review of Systems  Ten systems are reviewed and are negative for acute change except as noted in the HPI  Allergies  Review of patient's allergies indicates no known allergies.  Home Medications   Prior to Admission medications   Medication Sig Start Date End Date Taking? Authorizing Provider  ALPRAZolam Prudy Feeler(XANAX) 0.5 MG tablet Take 1 tablet by mouth 2 (two) times daily as needed for anxiety or sleep.  12/03/15   Historical Provider, MD  benzonatate (TESSALON) 100 MG capsule Take 2 capsules (200 mg total) by mouth 3 (three) times daily as needed. 12/30/15   Burgess AmorJulie Idol, PA-C  cetirizine-pseudoephedrine (ZYRTEC-D) 5-120 MG tablet Take 1 tablet by mouth 2 (two) times daily. 12/30/15   Burgess AmorJulie Idol, PA-C  guaiFENesin (MUCINEX) 600 MG 12 hr tablet Take 1,200 mg by mouth 2 (two) times daily as needed for cough or to loosen phlegm.    Historical Provider, MD  naproxen sodium (ALEVE) 220 MG tablet Take 220 mg by mouth 2 (two) times daily as needed  (pain/fever).    Historical Provider, MD  Phenylephrine-Pheniramine-DM [facility](THERAFLU COLD & COUGH PO) Take 30 mLs by mouth every 6 (six) hours as needed (cold/cough).    Historical Provider, MD   BP 140/100 mmHg  Pulse 73  Temp(Src) 98.4 F (36.9 C) (Oral)  Resp 18  Ht 5\' 7"  (1.702 m)  Wt 78.472 kg  BMI 27.09 kg/m2  SpO2 100% Physical Exam  Constitutional: She appears well-developed and well-nourished. No distress.  HENT:  Head: Normocephalic and atraumatic.  Mouth/Throat: Oropharynx is clear and moist. No oropharyngeal exudate.  Eyes: Conjunctivae are normal. Pupils are equal, round, and reactive to light. Right eye exhibits no discharge. Left eye exhibits no discharge. No scleral icterus.  Neck: No tracheal deviation present.  Cardiovascular: Normal rate, regular rhythm, normal heart sounds and intact distal pulses.   Pulmonary/Chest: Effort normal and breath sounds normal. No respiratory distress.  Abdominal: Soft. Bowel sounds are normal. She exhibits no distension.  Musculoskeletal: Normal range of motion. She exhibits tenderness. She exhibits no edema.  Hyperesthesias along left patella and left foot diffusely. No erythema, edema, discolorations. Neurovascularly intact bilaterally. Normal range of motion.  Lymphadenopathy:    She has no cervical adenopathy.  Neurological: She is alert. Coordination normal.  Skin: Skin is warm and dry. No rash noted. She is not diaphoretic. No erythema.  Psychiatric: She has a normal mood and affect. Her behavior is normal.  Nursing note and vitals reviewed.     ED Course  Procedures   MDM   Final diagnoses:  Left knee pain   Patient denies trauma and states this is an acute exacerbation of her chronic pain. Will hold off on x-rays at this time. Doubt DVT, septic joint. Patient will be given ace wrap and crutches as needed. Will sent home with symptomatic treatment. Discussed [facility] protocol. Patient may be safely discharged home. Discussed  reasons for return. Patient to follow-up with primary care provider within one- two weeks if pain continues. Patient in understanding and agreement with the plan.  Melton Krebs, PA-C 03/04/16 2141  Marily Memos, MD 03/06/16 [phone]

## 2016-03-04 NOTE — ED Notes (Signed)
Pt reports pain and numbness in her L leg and foot. Pt reports she has been seen here for the same in the past. Pt ambulatory to room and then to bathroom and back to room. Pt able to stand and move on leg when examining her son.

## 2016-03-18 ENCOUNTER — Emergency Department (HOSPITAL_COMMUNITY): Payer: Medicaid Other

## 2016-03-18 ENCOUNTER — Emergency Department (HOSPITAL_COMMUNITY)
Admission: EM | Admit: 2016-03-18 | Discharge: 2016-03-18 | Disposition: A | Discharge status: Home / Self Care | Payer: Medicaid Other | Attending: Emergency Medicine | Admitting: Emergency Medicine

## 2016-03-18 ENCOUNTER — Encounter (HOSPITAL_COMMUNITY): Payer: Self-pay | Admitting: Emergency Medicine

## 2016-03-18 DIAGNOSIS — Y939 Activity, unspecified: Secondary | ICD-10-CM | POA: Diagnosis not present

## 2016-03-18 DIAGNOSIS — S91209A Unspecified open wound of unspecified toe(s) with damage to nail, initial encounter: Secondary | ICD-10-CM

## 2016-03-18 DIAGNOSIS — Y9289 Other specified places as the place of occurrence of the external cause: Secondary | ICD-10-CM | POA: Diagnosis not present

## 2016-03-18 DIAGNOSIS — Y999 Unspecified external cause status: Secondary | ICD-10-CM | POA: Insufficient documentation

## 2016-03-18 DIAGNOSIS — S91205A Unspecified open wound of left lesser toe(s) with damage to nail, initial encounter: Secondary | ICD-10-CM | POA: Insufficient documentation

## 2016-03-18 DIAGNOSIS — S93402A Sprain of unspecified ligament of left ankle, initial encounter: Secondary | ICD-10-CM | POA: Diagnosis not present

## 2016-03-18 DIAGNOSIS — W01198A Fall on same level from slipping, tripping and stumbling with subsequent striking against other object, initial encounter: Secondary | ICD-10-CM | POA: Diagnosis not present

## 2016-03-18 DIAGNOSIS — M25572 Pain in left ankle and joints of left foot: Secondary | ICD-10-CM | POA: Diagnosis present

## 2016-03-18 MED ORDER — TETANUS-DIPHTH-ACELL PERTUSSIS 5-2.5-18.5 LF-MCG/0.5 IM SUSP
0.5000 mL | Freq: Once | INTRAMUSCULAR | Status: AC
  Administered 2016-03-18: 0.5 mL via INTRAMUSCULAR
  Filled 2016-03-18: qty 0.5

## 2016-03-18 MED ORDER — NAPROXEN 500 MG PO TABS: 500.0000 mg | ORAL_TABLET | Freq: Two times a day (BID) | ORAL | Status: AC

## 2016-03-18 MED ORDER — BACITRACIN ZINC 500 UNIT/GM EX OINT
TOPICAL_OINTMENT | CUTANEOUS | Status: AC
  Filled 2016-03-18: qty 0.9

## 2016-03-18 MED ORDER — IBUPROFEN 400 MG PO TABS
600.0000 mg | ORAL_TABLET | Freq: Once | ORAL | Status: AC
  Administered 2016-03-18: 600 mg via ORAL
  Filled 2016-03-18: qty 2

## 2016-03-18 NOTE — ED Notes (Signed)
Bacitracin and dressing applied to pt second toe on left foot per Cylinderatayana, PA.

## 2016-03-18 NOTE — ED Notes (Signed)
Pt reports slipping on a pool ladder yesterday. C/o pain to LT shin and states she ripped her toenail off on her LT foot. Pt ambulatory.

## 2016-03-18 NOTE — ED Notes (Signed)
Pt made aware to return if symptoms worsen or if any life threatening symptoms occur.   

## 2016-03-18 NOTE — ED Notes (Signed)
Tatayana made aware of pt bp, pt told to get bp rechecked at primary doctor and eat healthy diet.

## 2016-03-18 NOTE — ED Provider Notes (Signed)
CSN: [id number]     Arrival date & time 03/18/16  1228 History   First MD Initiated Contact with Patient 03/18/16 1308     Chief Complaint  Patient presents with  . Fall     (Consider location/radiation/quality/duration/timing/severity/associated sxs/prior Treatment) HPI Valentino Hueameka Servantes is a 40 y.o. female presents to emergency department complaining of an ankle pain after a fall. Patient states she was getting out of the pool when her foot slipped and she hit it on the side of the pole. She reports pain and swelling to the left lower shin and left ankle as well as second toe. She states there is some bleeding from her toenail. This incident occurred yesterday. She states it was throbbing tonight so she decided to come to emergency department this morning to get it looked at. She is unsure of her last tetanus. No medication taken prior to coming in. Patient is able to walk on the leg.  History reviewed. No pertinent past medical history. Past Surgical History  Procedure Laterality Date  . Cesarean section    . Knee surgery     No family history on file. Social History  Substance Use Topics  . Smoking status: Never Smoker   . Smokeless tobacco: Never Used  . Alcohol Use: No   OB History    Gravida Para Term Preterm AB TAB SAB Ectopic Multiple Living   3 2 2  1  1         Review of Systems  Constitutional: Negative for fever and chills.  Musculoskeletal: Positive for joint swelling, arthralgias and gait problem.  Skin: Positive for color change and wound.  Neurological: Negative for weakness and numbness.      Allergies  Review of patient's allergies indicates no known allergies.  Home Medications   Prior to Admission medications   Medication Sig Start Date End Date Taking? Authorizing Provider  ALPRAZolam Prudy Feeler(XANAX) 0.5 MG tablet Take 1 tablet by mouth 2 (two) times daily as needed for anxiety or sleep.  12/03/15   Historical Provider, MD  benzonatate (TESSALON) 100 MG  capsule Take 2 capsules (200 mg total) by mouth 3 (three) times daily as needed. 12/30/15   Burgess AmorJulie Idol, PA-C  cetirizine-pseudoephedrine (ZYRTEC-D) 5-120 MG tablet Take 1 tablet by mouth 2 (two) times daily. 12/30/15   Burgess AmorJulie Idol, PA-C  guaiFENesin (MUCINEX) 600 MG 12 hr tablet Take 1,200 mg by mouth 2 (two) times daily as needed for cough or to loosen phlegm.    Historical Provider, MD  methocarbamol (ROBAXIN) 500 MG tablet Take 1 tablet (500 mg total) by mouth 2 (two) times daily. 03/04/16   Melton KrebsSamantha Nicole Riley, PA-C  naproxen (NAPROSYN) 500 MG tablet Take 1 tablet (500 mg total) by mouth 2 (two) times daily. 03/04/16   Melton KrebsSamantha Nicole Riley, PA-C  naproxen sodium (ALEVE) 220 MG tablet Take 220 mg by mouth 2 (two) times daily as needed (pain/fever).    Historical Provider, MD  Phenylephrine-Pheniramine-DM [facility](THERAFLU COLD & COUGH PO) Take 30 mLs by mouth every 6 (six) hours as needed (cold/cough).    Historical Provider, MD   BP 148/98 mmHg  Pulse 81  Temp(Src) 98.4 F (36.9 C) (Oral)  Resp 16  Ht 5\' 7"  (1.702 m)  Wt 78.472 kg  BMI 27.09 kg/m2  SpO2 100%  LMP 03/16/2016 Physical Exam  Constitutional: She appears well-developed and well-nourished. No distress.  Eyes: Conjunctivae are normal.  Neck: Neck supple.  Musculoskeletal:  Mild swelling noted to the lateral malleolus of the  left ankle. Tenderness to palpation over lateral malleolus. Achilles tendon intact. DP pulses present. Complete nail avulsion of the 2nd toe. Hemostatic. No lacerations.   Neurological: She is alert.  Skin: Skin is warm and dry.  Nursing note and vitals reviewed.   ED Course  Procedures (including critical care time) Labs Review Labs Reviewed - No data to display  Imaging Review No results found. I have personally reviewed and evaluated these images and lab results as part of my medical decision-making.   EKG Interpretation None      MDM   Final diagnoses:  Toenail avulsion, initial encounter   Ankle sprain, left, initial encounter   Patient with left ankle injury and toenail avulsion which occurred yesterday. X-ray of the ankle obtained and is negative. X-ray of the second toe obtained and is negative. Tetanus updated. Her foot was washed and soaked in water and soap. Dressing applied to the toe. Discharge home with wound care, watch for signs of infection, follow up as needed. Ice and elevation to the ankle. NSAIDs for pain.   Filed Vitals:   03/18/16 1304 03/18/16 1426  BP: 148/98 152/106  Pulse: 81 73  Temp: 98.4 F (36.9 C)   TempSrc: Oral   Resp: 16 16  Height:  (1.702 m)   Weight: 78.472 kg   SpO2: 100% 100%     Jaynie Crumble, PA-C 03/18/16 1627  Blane Ohara, MD 03/19/16 [phone]

## 2016-03-18 NOTE — Discharge Instructions (Signed)
Ice and elevate your ankle. Take naproxen for pain as prescribed. Keep your foot clean and keep the toe covered. Bacitracin or Neosporin twice a day to the toenail. Follow-up with your doctor as needed. Watch for any signs of infection.   Ankle Sprain An ankle sprain is an injury to the strong, fibrous tissues (ligaments) that hold the bones of your ankle joint together.  CAUSES An ankle sprain is usually caused by a fall or by twisting your ankle. Ankle sprains most commonly occur when you step on the outer edge of your foot, and your ankle turns inward. People who participate in sports are more prone to these types of injuries.  SYMPTOMS   Pain in your ankle. The pain may be present at rest or only when you are trying to stand or walk.  Swelling.  Bruising. Bruising may develop immediately or within 1 to 2 days after your injury.  Difficulty standing or walking, particularly when turning corners or changing directions. DIAGNOSIS  Your caregiver will ask you details about your injury and perform a physical exam of your ankle to determine if you have an ankle sprain. During the physical exam, your caregiver will press on and apply pressure to specific areas of your foot and ankle. Your caregiver will try to move your ankle in certain ways. An X-ray exam may be done to be sure a bone was not broken or a ligament did not separate from one of the bones in your ankle (avulsion fracture).  TREATMENT  Certain types of braces can help stabilize your ankle. Your caregiver can make a recommendation for this. Your caregiver may recommend the use of medicine for pain. If your sprain is severe, your caregiver may refer you to a surgeon who helps to restore function to parts of your skeletal system (orthopedist) or a physical therapist. HOME CARE INSTRUCTIONS   Apply ice to your injury for 1-2 days or as directed by your caregiver. Applying ice helps to reduce inflammation and pain.  Put ice in a plastic  bag.  Place a towel between your skin and the bag.  Leave the ice on for 15-20 minutes at a time, every 2 hours while you are awake.  Only take over-the-counter or prescription medicines for pain, discomfort, or fever as directed by your caregiver.  Elevate your injured ankle above the level of your heart as much as possible for 2-3 days.  If your caregiver recommends crutches, use them as instructed. Gradually put weight on the affected ankle. Continue to use crutches or a cane until you can walk without feeling pain in your ankle.  If you have a plaster splint, wear the splint as directed by your caregiver. Do not rest it on anything harder than a pillow for the first 24 hours. Do not put weight on it. Do not get it wet. You may take it off to take a shower or bath.  You may have been given an elastic bandage to wear around your ankle to provide support. If the elastic bandage is too tight (you have numbness or tingling in your foot or your foot becomes cold and blue), adjust the bandage to make it comfortable.  If you have an air splint, you may blow more air into it or let air out to make it more comfortable. You may take your splint off at night and before taking a shower or bath. Wiggle your toes in the splint several times per day to decrease swelling. SEEK MEDICAL CARE  IF:   You have rapidly increasing bruising or swelling.  Your toes feel extremely cold or you lose feeling in your foot.  Your pain is not relieved with medicine. SEEK IMMEDIATE MEDICAL CARE IF:  Your toes are numb or blue.  You have severe pain that is increasing. MAKE SURE YOU:   Understand these instructions.  Will watch your condition.  Will get help right away if you are not doing well or get worse.   This information is not intended to replace advice given to you by your health care provider. Make sure you discuss any questions you have with your health care provider.   Document Released: 09/22/2005  Document Revised: 10/13/2014 Document Reviewed: 10/04/2011 Elsevier Interactive Patient Education 2016 [employer]   Nail Avulsion Injury Nail avulsion means that you have lost the whole, or part of a nail. The nail will usually grow back in 2 to 6 months. If your injury damaged the growth center of the nail, the nail may be deformed, split, or not stuck to the nail bed. Sometimes the avulsed nail is stitched back in place. This provides temporary protection to the nail bed until the new nail grows in.  HOME CARE INSTRUCTIONS   Raise (elevate) your injury as much as possible.  Protect the injury and cover it with bandages (dressings) or splints as instructed.  Change dressings as instructed. SEEK MEDICAL CARE IF:   There is increasing pain, redness, or swelling.  You cannot move your fingers or toes.   This information is not intended to replace advice given to you by your health care provider. Make sure you discuss any questions you have with your health care provider.   Document Released: 10/30/2004 Document Revised: 12/15/2011 Document Reviewed: 08/24/2009 Elsevier Interactive Patient Education [employer]

## 2016-04-01 ENCOUNTER — Emergency Department (HOSPITAL_COMMUNITY)
Admission: EM | Admit: 2016-04-01 | Discharge: 2016-04-01 | Disposition: A | Discharge status: Home / Self Care | Payer: Medicaid Other | Attending: Emergency Medicine | Admitting: Emergency Medicine

## 2016-04-01 ENCOUNTER — Emergency Department (HOSPITAL_COMMUNITY): Payer: Medicaid Other

## 2016-04-01 ENCOUNTER — Encounter (HOSPITAL_COMMUNITY): Payer: Self-pay | Admitting: Emergency Medicine

## 2016-04-01 DIAGNOSIS — I10 Essential (primary) hypertension: Secondary | ICD-10-CM | POA: Insufficient documentation

## 2016-04-01 DIAGNOSIS — Z79899 Other long term (current) drug therapy: Secondary | ICD-10-CM | POA: Insufficient documentation

## 2016-04-01 HISTORY — DX: Essential (primary) hypertension: I10

## 2016-04-01 LAB — CBC WITH DIFFERENTIAL/PLATELET
Basophils Absolute: 0 10*3/uL (ref 0.0–0.1)
Basophils Relative: 1 %
EOS ABS: 0.2 10*3/uL (ref 0.0–0.7)
EOS PCT: 2 %
HCT: 35.6 % — ABNORMAL LOW (ref 36.0–46.0)
Hemoglobin: 11.8 g/dL — ABNORMAL LOW (ref 12.0–15.0)
LYMPHS ABS: 2.4 10*3/uL (ref 0.7–4.0)
LYMPHS PCT: 37 %
MCH: 27.4 pg (ref 26.0–34.0)
MCHC: 33.1 g/dL (ref 30.0–36.0)
MCV: 82.6 fL (ref 78.0–100.0)
MONO ABS: 0.8 10*3/uL (ref 0.1–1.0)
MONOS PCT: 12 %
Neutro Abs: 3.2 10*3/uL (ref 1.7–7.7)
Neutrophils Relative %: 48 %
PLATELETS: 235 10*3/uL (ref 150–400)
RBC: 4.31 MIL/uL (ref 3.87–5.11)
RDW: 15.4 % (ref 11.5–15.5)
WBC: 6.6 10*3/uL (ref 4.0–10.5)

## 2016-04-01 LAB — COMPREHENSIVE METABOLIC PANEL
ALT: 10 U/L — AB (ref 14–54)
ANION GAP: 7 (ref 5–15)
AST: 15 U/L (ref 15–41)
Albumin: 4.1 g/dL (ref 3.5–5.0)
Alkaline Phosphatase: 50 U/L (ref 38–126)
BUN: 10 mg/dL (ref 6–20)
CHLORIDE: 106 mmol/L (ref 101–111)
CO2: 26 mmol/L (ref 22–32)
CREATININE: 0.94 mg/dL (ref 0.44–1.00)
Calcium: 8.5 mg/dL — ABNORMAL LOW (ref 8.9–10.3)
GFR calc non Af Amer: >60 mL/min (ref >60)
Glucose, Bld: 67 mg/dL (ref 65–99)
POTASSIUM: 3.6 mmol/L (ref 3.5–5.1)
SODIUM: 139 mmol/L (ref 135–145)
Total Bilirubin: 0.4 mg/dL (ref 0.3–1.2)
Total Protein: 7.5 g/dL (ref 6.5–8.1)

## 2016-04-01 MED ORDER — HYDRALAZINE HCL 20 MG/ML IJ SOLN
10.0000 mg | Freq: Once | INTRAMUSCULAR | Status: AC
  Administered 2016-04-01: 10 mg via INTRAVENOUS
  Filled 2016-04-01: qty 1

## 2016-04-01 MED ORDER — HYDRALAZINE HCL 20 MG/ML IJ SOLN
5.0000 mg | Freq: Once | INTRAMUSCULAR | Status: AC
  Administered 2016-04-01: 5 mg via INTRAVENOUS
  Filled 2016-04-01: qty 1

## 2016-04-01 MED ORDER — IBUPROFEN 800 MG PO TABS
800.0000 mg | ORAL_TABLET | Freq: Once | ORAL | Status: AC
  Administered 2016-04-01: 800 mg via ORAL
  Filled 2016-04-01: qty 1

## 2016-04-01 MED ORDER — LISINOPRIL 20 MG PO TABS: 20.0000 mg | ORAL_TABLET | Freq: Every day | ORAL | Status: DC

## 2016-04-01 MED ORDER — TRAMADOL HCL 50 MG PO TABS: 50.0000 mg | ORAL_TABLET | Freq: Four times a day (QID) | ORAL | Status: DC | PRN

## 2016-04-01 NOTE — Discharge Instructions (Signed)
Follow up with your md in 2-3 days °

## 2016-04-01 NOTE — ED Notes (Signed)
Pt states she went to her pcp today for follow up regarding her elevated blood pressure from 2 weeks ago.  States she was sent here for further eval.

## 2016-04-03 NOTE — ED Provider Notes (Signed)
CSN: [id number]     Arrival date & time 04/01/16  1700 History   First MD Initiated Contact with Patient 04/01/16 1721     Chief Complaint  Patient presents with  . Hypertension     (Consider location/radiation/quality/duration/timing/severity/associated sxs/prior Treatment) Patient is a 40 y.o. female presenting with hypertension. The history is provided by the patient (pt complains of high blood pressure and some headaches).  Hypertension This is a new problem. The current episode started 12 to 24 hours ago. The problem occurs constantly. The problem has not changed since onset.Pertinent negatives include no chest pain, no abdominal pain and no headaches. Nothing aggravates the symptoms.    Past Medical History  Diagnosis Date  . Hypertension    Past Surgical History  Procedure Laterality Date  . Cesarean section    . Knee surgery     History reviewed. No pertinent family history. Social History  Substance Use Topics  . Smoking status: Never Smoker   . Smokeless tobacco: Never Used  . Alcohol Use: No   OB History    Gravida Para Term Preterm AB TAB SAB Ectopic Multiple Living   3 2 2  1  1         Review of Systems  Constitutional: Negative for appetite change and fatigue.  HENT: Negative for congestion, ear discharge and sinus pressure.   Eyes: Negative for discharge.  Respiratory: Negative for cough.   Cardiovascular: Negative for chest pain.  Gastrointestinal: Negative for abdominal pain and diarrhea.  Genitourinary: Negative for frequency and hematuria.  Musculoskeletal: Negative for back pain.  Skin: Negative for rash.  Neurological: Negative for seizures and headaches.  Psychiatric/Behavioral: Negative for hallucinations.      Allergies  Review of patient's allergies indicates no known allergies.  Home Medications   Prior to Admission medications   Medication Sig Start Date End Date Taking? Authorizing Provider  ALPRAZolam Prudy Feeler(XANAX) 0.5 MG tablet Take 1  tablet by mouth 2 (two) times daily.  12/03/15  Yes Historical Provider, MD  cetirizine-pseudoephedrine (ZYRTEC-D) 5-120 MG tablet Take 1 tablet by mouth 2 (two) times daily. Patient taking differently: Take 1 tablet by mouth 2 (two) times daily as needed for allergies.  12/30/15  Yes Raynelle FanningJulie Idol, PA-C  benzonatate (TESSALON) 100 MG capsule Take 2 capsules (200 mg total) by mouth 3 (three) times daily as needed. Patient not taking: Reported on 03/18/2016 12/30/15   Burgess AmorJulie Idol, PA-C  lisinopril (PRINIVIL,ZESTRIL) 20 MG tablet Take 1 tablet (20 mg total) by mouth daily. 04/01/16   Bethann BerkshireJoseph Tylisha Danis, MD  methocarbamol (ROBAXIN) 500 MG tablet Take 1 tablet (500 mg total) by mouth 2 (two) times daily. Patient not taking: Reported on 04/01/2016 03/04/16   Melton KrebsSamantha Nicole Riley, PA-C  naproxen (NAPROSYN) 500 MG tablet Take 1 tablet (500 mg total) by mouth 2 (two) times daily. Patient not taking: Reported on 04/01/2016 03/04/16   Melton KrebsSamantha Nicole Riley, PA-C  naproxen (NAPROSYN) 500 MG tablet Take 1 tablet (500 mg total) by mouth 2 (two) times daily. Patient not taking: Reported on 04/01/2016 03/18/16   Tatyana Kirichenko, PA-C  traMADol (ULTRAM) 50 MG tablet Take 1 tablet (50 mg total) by mouth every 6 (six) hours as needed for moderate pain. 04/01/16   Bethann BerkshireJoseph Raja Liska, MD   BP 145/107 mmHg  Pulse 98  Temp(Src) 99.1 F (37.3 C) (Oral)  Resp 18  Ht 5\' 7"  (1.702 m)  Wt 170 lb (77.111 kg)  BMI 26.62 kg/m2  SpO2 100%  LMP 04/01/2016 Physical Exam  Constitutional: She is oriented to person, place, and time. She appears well-developed.  HENT:  Head: Normocephalic.  Eyes: Conjunctivae and EOM are normal. No scleral icterus.  Neck: Neck supple. No thyromegaly present.  Cardiovascular: Normal rate and regular rhythm.  Exam reveals no gallop and no friction rub.   No murmur heard. Pulmonary/Chest: No stridor. She has no wheezes. She has no rales. She exhibits no tenderness.  Abdominal: She exhibits no distension. There  is no tenderness. There is no rebound.  Musculoskeletal: Normal range of motion. She exhibits no edema.  Minor weakness in left arm.  Mild tender left thigh with minor weakness  Lymphadenopathy:    She has no cervical adenopathy.  Neurological: She is oriented to person, place, and time. She exhibits normal muscle tone. Coordination normal.  Skin: No rash noted. No erythema.  Psychiatric: She has a normal mood and affect. Her behavior is normal.    ED Course  Procedures (including critical care time) Labs Review Labs Reviewed  CBC WITH DIFFERENTIAL/PLATELET - Abnormal; Notable for the following:    Hemoglobin 11.8 (*)    HCT 35.6 (*)    All other components within normal limits  COMPREHENSIVE METABOLIC PANEL - Abnormal; Notable for the following:    Calcium 8.5 (*)    ALT 10 (*)    All other components within normal limits    Imaging Review Dg Chest 2 View  04/01/2016  CLINICAL DATA:  40 year old with acute onset of shortness of breath. Headache yesterday, now with left-sided weakness. Current history of hypertension. EXAM: CHEST  2 VIEW COMPARISON:  12/30/2015. FINDINGS: Cardiomediastinal silhouette unremarkable, unchanged. Lungs clear. Bronchovascular markings normal. Pulmonary vascularity normal. No visible pleural effusions. No pneumothorax. Visualized bony thorax intact. No significant interval change. IMPRESSION: No acute cardiopulmonary disease.  Stable examination. Electronically Signed   By: Hulan Saashomas  Lawrence M.D.   On: 04/01/2016 17:54   Mr Brain Wo Contrast  04/01/2016  CLINICAL DATA:  Weakness.  Hypertension EXAM: MRI HEAD WITHOUT CONTRAST TECHNIQUE: Multiplanar, multiecho pulse sequences of the brain and surrounding structures were obtained without intravenous contrast. COMPARISON:  None. FINDINGS: Ventricle size normal.  Cerebral volume normal. Negative for acute or chronic infarction Negative for demyelinating disease. Cerebral white matter normal. Basal ganglia and  brainstem normal. Negative for intracranial hemorrhage.  No fluid collection Negative for mass or edema.  No shift of the midline structures Paranasal sinuses clear.  Normal orbit.  Pituitary not enlarged. IMPRESSION: Negative Electronically Signed   By: Marlan Palauharles  Clark M.D.   On: 04/01/2016 19:06   I have personally reviewed and evaluated these images and lab results as part of my medical decision-making.   EKG Interpretation   Date/Time:  Tuesday April 01 2016 17:51:56 EDT Ventricular Rate:  85 PR Interval:    QRS Duration: 139 QT Interval:  405 QTC Calculation: 482 R Axis:   85 Text Interpretation:  Sinus rhythm Right bundle branch block Confirmed by  Juquan Reznick  MD, Jomarie LongsJOSEPH [phone]([id number]) on 04/01/2016 8:55:09 PM Also confirmed by  Myron Lona  MD, Jomarie LongsJOSEPH [phone]([id number])  on 04/01/2016 8:55:28 PM      MDM   Final diagnoses:  Essential hypertension    Mri neg.  Pt sent home on bp meds and will follow up with pcp    Bethann BerkshireJoseph Carena Stream, MD 04/03/16 1056

## 2017-05-29 ENCOUNTER — Telehealth (HOSPITAL_COMMUNITY): Payer: Self-pay | Admitting: *Deleted

## 2017-05-29 NOTE — Telephone Encounter (Signed)
Office received ref from [facility] to sch new pt appt for pt. Called pt at [phone] and phone only rang 2 times and it went silent and phone hung up. Called pt at [phone] and pt mother picked up stating pt was not there at the moment. Office number was provided to pt mother for pt to call back. Pt mother verbalized understanding.

## 2017-06-05 ENCOUNTER — Ambulatory Visit (HOSPITAL_COMMUNITY): Payer: Medicaid Other | Admitting: Psychiatry

## 2019-03-02 ENCOUNTER — Other Ambulatory Visit (HOSPITAL_COMMUNITY): Payer: Self-pay | Admitting: Internal Medicine

## 2019-03-02 DIAGNOSIS — Z1231 Encounter for screening mammogram for malignant neoplasm of breast: Secondary | ICD-10-CM

## 2019-03-21 ENCOUNTER — Ambulatory Visit (HOSPITAL_COMMUNITY): Payer: Medicaid Other

## 2019-03-24 ENCOUNTER — Ambulatory Visit (HOSPITAL_COMMUNITY): Payer: Medicaid Other

## 2019-03-30 ENCOUNTER — Inpatient Hospital Stay (HOSPITAL_COMMUNITY): Admission: RE | Admit: 2019-03-30 | Payer: Medicaid Other | Source: Ambulatory Visit

## 2019-03-30 ENCOUNTER — Other Ambulatory Visit (HOSPITAL_COMMUNITY): Payer: Self-pay | Admitting: Hematology

## 2019-04-15 ENCOUNTER — Inpatient Hospital Stay (HOSPITAL_COMMUNITY): Admission: RE | Admit: 2019-04-15 | Payer: Medicaid Other | Source: Ambulatory Visit

## 2019-05-06 ENCOUNTER — Telehealth: Payer: Self-pay | Admitting: Adult Health

## 2019-05-06 NOTE — Telephone Encounter (Signed)
Unable to reach pt with restrictions.  

## 2019-05-09 ENCOUNTER — Other Ambulatory Visit: Payer: Self-pay | Admitting: Adult Health

## 2019-05-30 ENCOUNTER — Other Ambulatory Visit (HOSPITAL_COMMUNITY): Payer: Self-pay | Admitting: Internal Medicine

## 2019-05-30 DIAGNOSIS — Z1231 Encounter for screening mammogram for malignant neoplasm of breast: Secondary | ICD-10-CM

## 2019-06-15 ENCOUNTER — Inpatient Hospital Stay (HOSPITAL_COMMUNITY): Admission: RE | Admit: 2019-06-15 | Payer: Medicaid Other | Source: Ambulatory Visit

## 2019-08-10 ENCOUNTER — Telehealth: Payer: Self-pay | Admitting: Adult Health

## 2019-08-10 NOTE — Telephone Encounter (Signed)
Called the patient to remind her of her appointment/restrictions, mailbox is not setup.

## 2019-08-11 ENCOUNTER — Other Ambulatory Visit: Payer: Self-pay | Admitting: Adult Health

## 2021-09-24 ENCOUNTER — Ambulatory Visit (INDEPENDENT_AMBULATORY_CARE_PROVIDER_SITE_OTHER): Payer: Medicaid Other | Admitting: Psychiatry

## 2021-09-24 ENCOUNTER — Other Ambulatory Visit: Payer: Self-pay

## 2021-09-24 ENCOUNTER — Encounter (HOSPITAL_COMMUNITY): Payer: Self-pay | Admitting: Psychiatry

## 2021-09-24 VITALS — BP 140/119 | HR 75 | Ht 67.0 in | Wt 190.0 lb

## 2021-09-24 DIAGNOSIS — F4310 Post-traumatic stress disorder, unspecified: Secondary | ICD-10-CM

## 2021-09-24 DIAGNOSIS — F250 Schizoaffective disorder, bipolar type: Secondary | ICD-10-CM

## 2021-09-24 DIAGNOSIS — F411 Generalized anxiety disorder: Secondary | ICD-10-CM | POA: Diagnosis not present

## 2021-09-24 MED ORDER — QUETIAPINE FUMARATE 25 MG PO TABS
25.0000 mg | ORAL_TABLET | Freq: Two times a day (BID) | ORAL | 3 refills | Status: DC
Start: 2021-09-24 — End: 2021-12-10

## 2021-09-24 MED ORDER — QUETIAPINE FUMARATE ER 400 MG PO TB24: 400.0000 mg | ORAL_TABLET | Freq: Every day | ORAL | 3 refills | Status: DC

## 2021-09-24 MED ORDER — GABAPENTIN 300 MG PO CAPS: 300.0000 mg | ORAL_CAPSULE | Freq: Three times a day (TID) | ORAL | 3 refills | Status: DC

## 2021-09-24 MED ORDER — DIVALPROEX SODIUM ER 250 MG PO TB24
250.0000 mg | ORAL_TABLET | Freq: Two times a day (BID) | ORAL | 3 refills | Status: DC
Start: 2021-09-24 — End: 2021-12-10

## 2021-09-24 MED ORDER — HYDROXYZINE HCL 50 MG PO TABS: 50.0000 mg | ORAL_TABLET | Freq: Three times a day (TID) | ORAL | 3 refills | Status: DC | PRN

## 2021-09-24 MED ORDER — ESCITALOPRAM OXALATE 20 MG PO TABS: 20.0000 mg | ORAL_TABLET | Freq: Every day | ORAL | 3 refills | Status: DC
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
## 2021-11-25 ENCOUNTER — Ambulatory Visit (HOSPITAL_COMMUNITY): Payer: Medicaid Other | Admitting: Clinical

## 2021-12-10 ENCOUNTER — Telehealth (INDEPENDENT_AMBULATORY_CARE_PROVIDER_SITE_OTHER): Payer: Medicaid Other | Admitting: Psychiatry

## 2021-12-10 DIAGNOSIS — F250 Schizoaffective disorder, bipolar type: Secondary | ICD-10-CM | POA: Diagnosis not present

## 2021-12-10 DIAGNOSIS — F5105 Insomnia due to other mental disorder: Secondary | ICD-10-CM

## 2021-12-10 DIAGNOSIS — F411 Generalized anxiety disorder: Secondary | ICD-10-CM | POA: Diagnosis not present

## 2021-12-10 DIAGNOSIS — F4310 Post-traumatic stress disorder, unspecified: Secondary | ICD-10-CM

## 2021-12-10 MED ORDER — TRAZODONE HCL 50 MG PO TABS
50.0000 mg | ORAL_TABLET | Freq: Every evening | ORAL | 2 refills | Status: DC | PRN
Start: 2021-12-10 — End: 2022-03-04

## 2021-12-10 MED ORDER — GABAPENTIN 300 MG PO CAPS: 300.0000 mg | ORAL_CAPSULE | Freq: Three times a day (TID) | ORAL | 3 refills | Status: DC

## 2021-12-10 MED ORDER — DIVALPROEX SODIUM ER 250 MG PO TB24: 250.0000 mg | ORAL_TABLET | Freq: Two times a day (BID) | ORAL | 3 refills | Status: DC

## 2021-12-10 MED ORDER — ESCITALOPRAM OXALATE 20 MG PO TABS: 20.0000 mg | ORAL_TABLET | Freq: Every day | ORAL | 3 refills | Status: DC

## 2021-12-10 MED ORDER — QUETIAPINE FUMARATE 25 MG PO TABS: 25.0000 mg | ORAL_TABLET | Freq: Two times a day (BID) | ORAL | 3 refills | Status: DC

## 2021-12-10 MED ORDER — HYDROXYZINE HCL 50 MG PO TABS: 50.0000 mg | ORAL_TABLET | Freq: Three times a day (TID) | ORAL | 3 refills | Status: DC | PRN

## 2021-12-10 MED ORDER — QUETIAPINE FUMARATE ER 400 MG PO TB24
400.0000 mg | ORAL_TABLET | Freq: Every day | ORAL | 3 refills | Status: DC
Start: 2021-12-10 — End: 2022-03-04

## 2021-12-10 NOTE — Progress Notes (Signed)
BH MD/PA/NP OP Progress Note  12/10/2021 8:19 PM [name]  MRN:  [id number]  Virtual Visit via Video Note  I connected with Paula Medina on 12/10/21 at  2:30 PM EST by a video enabled telemedicine application and verified that I am speaking with the correct person using two identifiers.  Location: Patient: home Provider: off site   I discussed the limitations of evaluation and management by telemedicine and the availability of in person appointments. The patient expressed understanding and agreed to proceed.    I discussed the assessment and treatment plan with the patient. The patient was provided an opportunity to ask questions and all were answered. The patient agreed with the plan and demonstrated an understanding of the instructions.   The patient was advised to call back or seek an in-person evaluation if the symptoms worsen or if the condition fails to improve as anticipated.  I provided 30 minutes of non-face-to-face time during this encounter.   Mcneil Sober, NP   Chief Complaint: Medication management  HPI: Paula Medina is a 46 year old female presenting to [facility] behavioral health outpatient for a follow-up psychiatric evaluation.  She has a past psychiatric history of schizoaffective disorder bipolar type and her symptoms are managed with Depakote to 50 mg ER twice daily, Lexapro 20 mg daily, hydroxyzine 50 mg 3 times daily as needed for anxiety, Seroquel 400 mg at bedtime, Seroquel 25 mg twice daily, and gabapentin 300 mg 3 times daily.  She reports medication compliance and denies adverse effects. Patient is alert and oriented x4, pleasant and willing to engage.  She is noted with an anxious mood and congruent affect.  Patient is well groomed and dressed appropriately for the weather.  She reports a good appetite but states she is not sleeping well at night.  Patient reported that she was previously prescribed trazodone while inpatient at [facility].   She reports that trazodone was effective for sleep.  Patient also reports that her mother is in the hospital when she is still mourning the death of her son.  Patient denies suicidal or homicidal ideations, paranoia, delusional thought, or auditory or visual hallucinations.   Visit Diagnosis:    ICD-10-CM   1. Schizoaffective disorder, bipolar type (HCC)  F25.0 divalproex (DEPAKOTE ER) 250 MG 24 hr tablet    escitalopram (LEXAPRO) 20 MG tablet    gabapentin (NEURONTIN) 300 MG capsule    QUEtiapine (SEROQUEL XR) 400 MG 24 hr tablet    QUEtiapine (SEROQUEL) 25 MG tablet    2. PTSD (post-traumatic stress disorder)  F43.10 escitalopram (LEXAPRO) 20 MG tablet    3. Generalized anxiety disorder  F41.1 escitalopram (LEXAPRO) 20 MG tablet    hydrOXYzine (ATARAX) 50 MG tablet    QUEtiapine (SEROQUEL XR) 400 MG 24 hr tablet    QUEtiapine (SEROQUEL) 25 MG tablet    4. Insomnia due to mental condition  F51.05       Past Psychiatric History: see above  Past Medical History:  Past Medical History:  Diagnosis Date   Hypertension     Past Surgical History:  Procedure Laterality Date   CESAREAN SECTION     KNEE SURGERY      Family Psychiatric History: None known  Family History: No family history on file.  Social History:  Social History   Socioeconomic History   Marital status: Single    Spouse name: Not on file   Number of children: Not on file   Years of education: Not on file  Highest education level: Not on file  Occupational History   Not on file  Tobacco Use   Smoking status: Never   Smokeless tobacco: Never  Substance and Sexual Activity   Alcohol use: No   Drug use: No   Sexual activity: Not Currently  Other Topics Concern   Not on file  Social History Narrative   Not on file   Social Determinants of Health   Financial Resource Strain: Not on file  Food Insecurity: Not on file  Transportation Needs: Not on file  Physical Activity: Not on file  Stress: Not  on file  Social Connections: Not on file    Allergies: No Known Allergies  Metabolic Disorder Labs: No results found for: HGBA1C, MPG No results found for: PROLACTIN No results found for: CHOL, TRIG, HDL, CHOLHDL, VLDL, LDLCALC No results found for: TSH  Therapeutic Level Labs: No results found for: LITHIUM No results found for: VALPROATE No components found for:  CBMZ  Current Medications: Current Outpatient Medications  Medication Sig Dispense Refill   ALPRAZolam (XANAX) 0.5 MG tablet Take 1 tablet by mouth 2 (two) times daily.   1   benzonatate (TESSALON) 100 MG capsule Take 2 capsules (200 mg total) by mouth 3 (three) times daily as needed. 30 capsule 0   cetirizine-pseudoephedrine (ZYRTEC-D) 5-120 MG tablet Take 1 tablet by mouth 2 (two) times daily. (Patient taking differently: Take 1 tablet by mouth 2 (two) times daily as needed for allergies. ) 30 tablet 0   lisinopril (PRINIVIL,ZESTRIL) 20 MG tablet Take 1 tablet (20 mg total) by mouth daily. 30 tablet 1   methocarbamol (ROBAXIN) 500 MG tablet Take 1 tablet (500 mg total) by mouth 2 (two) times daily. 20 tablet 0   naproxen (NAPROSYN) 500 MG tablet Take 1 tablet (500 mg total) by mouth 2 (two) times daily. 30 tablet 0   naproxen (NAPROSYN) 500 MG tablet Take 1 tablet (500 mg total) by mouth 2 (two) times daily. 30 tablet 0   traMADol (ULTRAM) 50 MG tablet Take 1 tablet (50 mg total) by mouth every 6 (six) hours as needed for moderate pain. 20 tablet 0   traZODone (DESYREL) 50 MG tablet Take 1 tablet (50 mg total) by mouth at bedtime as needed for sleep. 30 tablet 2   divalproex (DEPAKOTE ER) 250 MG 24 hr tablet Take 1 tablet (250 mg total) by mouth 2 (two) times daily. 60 tablet 3   escitalopram (LEXAPRO) 20 MG tablet Take 1 tablet (20 mg total) by mouth daily. 30 tablet 3   gabapentin (NEURONTIN) 300 MG capsule Take 1 capsule (300 mg total) by mouth 3 (three) times daily. 90 capsule 3   hydrOXYzine (ATARAX) 50 MG tablet Take  1 tablet (50 mg total) by mouth 3 (three) times daily as needed. 90 tablet 3   QUEtiapine (SEROQUEL XR) 400 MG 24 hr tablet Take 1 tablet (400 mg total) by mouth at bedtime. 30 tablet 3   QUEtiapine (SEROQUEL) 25 MG tablet Take 1 tablet (25 mg total) by mouth 2 (two) times daily. 60 tablet 3   No current facility-administered medications for this visit.     Musculoskeletal: Strength & Muscle Tone: N/A virtual visit Gait & Station: N/A virtual visit Patient leans: N/A  Psychiatric Specialty Exam: Review of Systems  Psychiatric/Behavioral:  Positive for sleep disturbance. Negative for hallucinations, self-injury and suicidal ideas. The patient is nervous/anxious.   All other systems reviewed and are negative.  There were no vitals taken for  this visit.There is no height or weight on file to calculate BMI.  General Appearance: Well Groomed  Eye Contact:  Good  Speech:  Clear and Coherent  Volume:  Normal  Mood:  Anxious  Affect:  Congruent  Thought Process:  Goal Directed  Orientation:  Full (Time, Place, and Person)  Thought Content: Logical   Suicidal Thoughts:  No  Homicidal Thoughts:  No  Memory: Good  Judgement: Good  Insight: Good  Psychomotor Activity: N/A  Concentration: Good  Recall: Good  Fund of Knowledge: Good  Language: Good  Akathisia: N/A  Handed: Right  AIMS (if indicated): not done  Assets:  Communication Skills Desire for Improvement  ADL's:  Intact  Cognition: WNL  Sleep:  Poor   Screenings: GAD-7    Flowsheet Row Office Visit from 09/24/2021 in [facility]  Total GAD-7 Score 21      PHQ2-9    Flowsheet Row Office Visit from 09/24/2021 in [facility]  PHQ-2 Total Score 5  PHQ-9 Total Score 18      Flowsheet Row Office Visit from 09/24/2021 in [facility]  C-SSRS RISK CATEGORY Error: Q7 should not be populated when Q6 is No        Assessment and  Plan: Trista Gadsden is a 46 year old female presenting to [facility] behavioral health outpatient for a follow-up psychiatric evaluation.  She has a past psychiatric history of schizoaffective disorder bipolar type and her symptoms are managed with Depakote to 50 mg ER twice daily, Lexapro 20 mg daily, hydroxyzine 50 mg 3 times daily as needed for anxiety, Seroquel 400 mg at bedtime, Seroquel 25 mg twice daily, and gabapentin 300 mg 3 times daily.  She reports medication compliance and denies adverse effects.  Today she reports difficulty sleeping at night.  Patient also reports that she received trazodone while inpatient at [facility] and it was effective.  This provider agreeable to initiating trazodone 50 mg at bedtime as needed for sleep.  Medication risk versus benefits discussed.  Patient also noted to be anxious during the visit.  Medication education conducted and patient acknowledged understanding that hydroxyzine is available to self administer for anxiety as needed.  Medications refilled.  Collaboration of Care: Collaboration of Care: Medication Management AEB medication E scribed to patient's preferred pharmacy  1. Schizoaffective disorder, bipolar type (HCC)   - divalproex (DEPAKOTE ER) 250 MG 24 hr tablet; Take 1 tablet (250 mg total) by mouth 2 (two) times daily.  Dispense: 60 tablet; Refill: 3 - escitalopram (LEXAPRO) 20 MG tablet; Take 1 tablet (20 mg total) by mouth daily.  Dispense: 30 tablet; Refill: 3 - gabapentin (NEURONTIN) 300 MG capsule; Take 1 capsule (300 mg total) by mouth 3 (three) times daily.  Dispense: 90 capsule; Refill: 3 - QUEtiapine (SEROQUEL XR) 400 MG 24 hr tablet; Take 1 tablet (400 mg total) by mouth at bedtime.  Dispense: 30 tablet; Refill: 3 - QUEtiapine (SEROQUEL) 25 MG tablet; Take 1 tablet (25 mg total) by mouth 2 (two) times daily.  Dispense: 60 tablet; Refill: 3  2. PTSD (post-traumatic stress disorder)   - escitalopram (LEXAPRO) 20 MG tablet;  Take 1 tablet (20 mg total) by mouth daily.  Dispense: 30 tablet; Refill: 3  3. Generalized anxiety disorder   - escitalopram (LEXAPRO) 20 MG tablet; Take 1 tablet (20 mg total) by mouth daily.  Dispense: 30 tablet; Refill: 3 - hydrOXYzine (ATARAX) 50 MG tablet; Take 1 tablet (50  mg total) by mouth 3 (three) times daily as needed.  Dispense: 90 tablet; Refill: 3 - QUEtiapine (SEROQUEL XR) 400 MG 24 hr tablet; Take 1 tablet (400 mg total) by mouth at bedtime.  Dispense: 30 tablet; Refill: 3 - QUEtiapine (SEROQUEL) 25 MG tablet; Take 1 tablet (25 mg total) by mouth 2 (two) times daily.  Dispense: 60 tablet; Refill: 3  Insomnia  - traZODone (DESYREL) 50 MG tablet; Take 1 tablet (50 mg total) by mouth at bedtime as needed for sleep.  Dispense: 30 tablet; Refill: 2   Follow-up in 3 months   Patient/Guardian was advised Release of Information must be obtained prior to any record release in order to collaborate their care with an outside provider. Patient/Guardian was advised if they have not already done so to contact the registration department to sign all necessary forms in order for us to release information regarding their care.   Consent: Patient/Guardian gives verbal consent for treatment and assignment of benefits for services provided during this visit. Patient/Guardian expressed understanding and agreed to proceed.    Mcneil Sobericely Jamerson Vonbargen, NP 12/10/2021, 8:19 PM

## 2022-01-25 ENCOUNTER — Other Ambulatory Visit: Payer: Self-pay | Admitting: Orthopedic Surgery

## 2022-01-25 ENCOUNTER — Other Ambulatory Visit (HOSPITAL_COMMUNITY): Payer: Self-pay | Admitting: Orthopedic Surgery

## 2022-01-25 DIAGNOSIS — S83209A Unspecified tear of unspecified meniscus, current injury, unspecified knee, initial encounter: Secondary | ICD-10-CM

## 2022-02-04 ENCOUNTER — Telehealth (HOSPITAL_COMMUNITY): Payer: Self-pay | Admitting: Psychiatry

## 2022-02-04 NOTE — Telephone Encounter (Signed)
Patient called to confirm appt on 5/2. Writer confirmed that patient has appt on 5/30, also informed that this would be the last appt with our office as she is living in [facility].  ?

## 2022-02-11 ENCOUNTER — Ambulatory Visit (HOSPITAL_COMMUNITY)
Admission: RE | Admit: 2022-02-11 | Discharge: 2022-02-11 | Disposition: A | Discharge status: Home / Self Care | Payer: Medicaid Other | Source: Ambulatory Visit | Attending: Orthopedic Surgery | Admitting: Orthopedic Surgery

## 2022-02-11 DIAGNOSIS — S83209A Unspecified tear of unspecified meniscus, current injury, unspecified knee, initial encounter: Secondary | ICD-10-CM | POA: Insufficient documentation

## 2022-02-27 ENCOUNTER — Other Ambulatory Visit (HOSPITAL_COMMUNITY): Payer: Self-pay | Admitting: Internal Medicine

## 2022-02-27 DIAGNOSIS — Z1231 Encounter for screening mammogram for malignant neoplasm of breast: Secondary | ICD-10-CM

## 2022-03-04 ENCOUNTER — Telehealth (HOSPITAL_COMMUNITY): Payer: Self-pay | Admitting: *Deleted

## 2022-03-04 ENCOUNTER — Encounter (HOSPITAL_COMMUNITY): Payer: Self-pay | Admitting: Psychiatry

## 2022-03-04 ENCOUNTER — Telehealth (INDEPENDENT_AMBULATORY_CARE_PROVIDER_SITE_OTHER): Payer: Medicaid Other | Admitting: Psychiatry

## 2022-03-04 DIAGNOSIS — F4310 Post-traumatic stress disorder, unspecified: Secondary | ICD-10-CM | POA: Diagnosis not present

## 2022-03-04 DIAGNOSIS — F250 Schizoaffective disorder, bipolar type: Secondary | ICD-10-CM

## 2022-03-04 DIAGNOSIS — F411 Generalized anxiety disorder: Secondary | ICD-10-CM | POA: Diagnosis not present

## 2022-03-04 DIAGNOSIS — F5105 Insomnia due to other mental disorder: Secondary | ICD-10-CM | POA: Diagnosis not present

## 2022-03-04 DIAGNOSIS — F99 Mental disorder, not otherwise specified: Secondary | ICD-10-CM

## 2022-03-04 MED ORDER — HYDROXYZINE HCL 50 MG PO TABS: 50.0000 mg | ORAL_TABLET | Freq: Three times a day (TID) | ORAL | 3 refills | Status: DC | PRN

## 2022-03-04 MED ORDER — QUETIAPINE FUMARATE ER 400 MG PO TB24: 400.0000 mg | ORAL_TABLET | Freq: Every day | ORAL | 3 refills | Status: DC

## 2022-03-04 MED ORDER — ESCITALOPRAM OXALATE 20 MG PO TABS: 20.0000 mg | ORAL_TABLET | Freq: Every day | ORAL | 3 refills | Status: DC

## 2022-03-04 MED ORDER — DIVALPROEX SODIUM ER 250 MG PO TB24: 250.0000 mg | ORAL_TABLET | Freq: Two times a day (BID) | ORAL | 3 refills | Status: DC

## 2022-03-04 MED ORDER — QUETIAPINE FUMARATE 50 MG PO TABS: 50.0000 mg | ORAL_TABLET | Freq: Two times a day (BID) | ORAL | 3 refills | Status: DC

## 2022-03-04 MED ORDER — TRAZODONE HCL 100 MG PO TABS: 100.0000 mg | ORAL_TABLET | Freq: Every evening | ORAL | 3 refills | Status: DC | PRN

## 2022-03-04 MED ORDER — GABAPENTIN 300 MG PO CAPS: 300.0000 mg | ORAL_CAPSULE | Freq: Three times a day (TID) | ORAL | 3 refills | Status: DC

## 2022-03-04 NOTE — Telephone Encounter (Signed)
At the request of Toy Cookey NP a sleep study referral and Brittneys two notes faxed to the [facility] Sleep Study for consideration.

## 2022-03-04 NOTE — Progress Notes (Signed)
BH MD/PA/NP OP Progress Note Virtual Visit via Telephone Note  I connected with Paula Medina on 03/04/22 at  3:00 PM EDT by telephone and verified that I am speaking with the correct person using two identifiers.  Location: Patient: home Provider: Clinic   I discussed the limitations, risks, security and privacy concerns of performing an evaluation and management service by telephone and the availability of in person appointments. I also discussed with the patient that there may be a patient responsible charge related to this service. The patient expressed understanding and agreed to proceed.   I provided 30 minutes of non-face-to-face time during this encounter.  03/04/2022 9:48 AM Paula Medina  MRN:  [id number]  Chief Complaint: "I am sad about my son's death"  HPI: 46 year old female seen today for follow up psychiatric evaluation.   She has a psychiatric history of SI, anxiety, depression, ADHD, schizophrenia, bipolar disorder, and cocaine use.  Currently she is managed on Depakote 250 mg twice daily,  trazodone 50 mg nightly, hydroxyzine 50 mg twice daily as needed, Lexapro 20 mg, daily, Seroquel 25 mg twice daily,  Seroquel 400 mg nightly, and gabapentin 300 mg 3 times a day.  She notes her medications are somewhat effective in managing her psychiatric conditions.   Today she is unable to logon virtually so assessment is done over the phone.  Patient notes that she is saddened by the death of her sons.  She notes that the anniversary will be coming up soon for both of her children's death.  She notes that she is frustrated that she does not know who killed her son.  Provider conducted a GAD-7 and patient scored a 17.  Provider also conducted a PHQ-9 and patient scored a 19.    Patient informed writer that her appetite has been poor however reports that she has been gaining weight.  She also notes that she has been suffering from insomnia noting that she is only sleeping 2 to 3 hours  nightly.  Provider referred patient to [facility] sleep center for further evaluation.  Patient endorses auditory hallucinations and paranoia.  He denies alcohol or illegal substance use.    Today she is agreeable to increasing Seroquel 25 mg twice daily to 50 mg twice daily.  She will also increase trazodone 50mg  to 100 mg to help manage sleep.  She will continue all other medications as prescribed.  [place] and will seek care from a provider in [place].  She notes that she has already found a provider.  No other concerns at this time.    Visit Diagnosis:    ICD-10-CM   1. Insomnia due to other mental disorder  F51.05 Polysomnography 4 or more parameters   F99 Ambulatory referral to Sleep Studies    2. Schizoaffective disorder, bipolar type (HCC)  F25.0 divalproex (DEPAKOTE ER) 250 MG 24 hr tablet    escitalopram (LEXAPRO) 20 MG tablet    gabapentin (NEURONTIN) 300 MG capsule    QUEtiapine (SEROQUEL XR) 400 MG 24 hr tablet    QUEtiapine (SEROQUEL) 50 MG tablet    3. PTSD (post-traumatic stress disorder)  F43.10 escitalopram (LEXAPRO) 20 MG tablet    4. Generalized anxiety disorder  F41.1 escitalopram (LEXAPRO) 20 MG tablet    hydrOXYzine (ATARAX) 50 MG tablet    QUEtiapine (SEROQUEL XR) 400 MG 24 hr tablet    QUEtiapine (SEROQUEL) 50 MG tablet      Past Psychiatric History:  anxiety, depression,  ADHD, schizophrenia, bipolar disorder, and cocaine use  Past Medical History:  Past Medical History:  Diagnosis Date   Hypertension     Past Surgical History:  Procedure Laterality Date   CESAREAN SECTION     KNEE SURGERY      Family Psychiatric History: Older son overdose from fentanyl and coccaine  Family History: No family history on file.  Social History:  Social History   Socioeconomic History   Marital status: Single    Spouse name: Not on file   Number of children: Not on file   Years of education: Not on file   Highest  education level: Not on file  Occupational History   Not on file  Tobacco Use   Smoking status: Never   Smokeless tobacco: Never  Substance and Sexual Activity   Alcohol use: No   Drug use: No   Sexual activity: Not Currently  Other Topics Concern   Not on file  Social History Narrative   Not on file   Social Determinants of Health   Financial Resource Strain: Not on file  Food Insecurity: Not on file  Transportation Needs: Not on file  Physical Activity: Not on file  Stress: Not on file  Social Connections: Not on file    Allergies: No Known Allergies  Metabolic Disorder Labs: No results found for: HGBA1C, MPG No results found for: PROLACTIN No results found for: CHOL, TRIG, HDL, CHOLHDL, VLDL, LDLCALC No results found for: TSH  Therapeutic Level Labs: No results found for: LITHIUM No results found for: VALPROATE No components found for:  CBMZ  Current Medications: Current Outpatient Medications  Medication Sig Dispense Refill   ALPRAZolam (XANAX) 0.5 MG tablet Take 1 tablet by mouth 2 (two) times daily.   1   benzonatate (TESSALON) 100 MG capsule Take 2 capsules (200 mg total) by mouth 3 (three) times daily as needed. 30 capsule 0   cetirizine-pseudoephedrine (ZYRTEC-D) 5-120 MG tablet Take 1 tablet by mouth 2 (two) times daily. (Patient taking differently: Take 1 tablet by mouth 2 (two) times daily as needed for allergies. ) 30 tablet 0   divalproex (DEPAKOTE ER) 250 MG 24 hr tablet Take 1 tablet (250 mg total) by mouth 2 (two) times daily. 60 tablet 3   escitalopram (LEXAPRO) 20 MG tablet Take 1 tablet (20 mg total) by mouth daily. 30 tablet 3   gabapentin (NEURONTIN) 300 MG capsule Take 1 capsule (300 mg total) by mouth 3 (three) times daily. 90 capsule 3   hydrOXYzine (ATARAX) 50 MG tablet Take 1 tablet (50 mg total) by mouth 3 (three) times daily as needed. 90 tablet 3   lisinopril (PRINIVIL,ZESTRIL) 20 MG tablet Take 1 tablet (20 mg total) by mouth daily. 30  tablet 1   methocarbamol (ROBAXIN) 500 MG tablet Take 1 tablet (500 mg total) by mouth 2 (two) times daily. 20 tablet 0   naproxen (NAPROSYN) 500 MG tablet Take 1 tablet (500 mg total) by mouth 2 (two) times daily. 30 tablet 0   QUEtiapine (SEROQUEL XR) 400 MG 24 hr tablet Take 1 tablet (400 mg total) by mouth at bedtime. 30 tablet 3   QUEtiapine (SEROQUEL) 50 MG tablet Take 1 tablet (50 mg total) by mouth 2 (two) times daily. 60 tablet 3   traZODone (DESYREL) 100 MG tablet Take 1 tablet (100 mg total) by mouth at bedtime as needed for sleep. 30 tablet 3   No current facility-administered medications for this visit.     Musculoskeletal:  Strength & Muscle Tone:  Unable to assess due to telephone visit Gait & Station:  unable to assess due to telephone visit Patient leans: N/A  Psychiatric Specialty Exam: Review of Systems  There were no vitals taken for this visit.There is no height or weight on file to calculate BMI.  General Appearance:  unable to assess due to telephone visit  Eye Contact:   unable to assess due to telephone visit  Speech:  Clear and Coherent and Normal Rate  Volume:  Normal  Mood:  Anxious and Depressed  Affect:  Appropriate and Congruent  Thought Process:  Coherent, Goal Directed, and Linear  Orientation:  Full (Time, Place, and Person)  Thought Content: Hallucinations: Auditory and Paranoid Ideation   Suicidal Thoughts:  Yes.  without intent/plan  Homicidal Thoughts:  No  Memory:  Immediate;   Good Recent;   Good Remote;   Good  Judgement:  Good  Insight:  Good  Psychomotor Activity:   unable to assess due to telephone visit  Concentration:  Concentration: Fair and Attention Span: Fair  Recall:  Good  Fund of Knowledge: Good  Language: Good  Akathisia:  unable to assess due to telephone visit no complaints  Handed:  Right  AIMS (if indicated): not done  Assets:  Communication Skills Desire for Improvement Housing Leisure Time Physical  Health Social Support  ADL's:  Intact  Cognition: WNL  Sleep:  Poor   Screenings: GAD-7    Flowsheet Row Video Visit from 03/04/2022 in [facility] Office Visit from 09/24/2021 in [facility]  Total GAD-7 Score 17 21      PHQ2-9    Flowsheet Row Video Visit from 03/04/2022 in [facility] Office Visit from 09/24/2021 in [facility]  PHQ-2 Total Score 4 5  PHQ-9 Total Score 19 18      Flowsheet Row Video Visit from 03/04/2022 in [facility] Office Visit from 09/24/2021 in [facility]  C-SSRS RISK CATEGORY Error: Q7 should not be populated when Q6 is No Error: Q7 should not be populated when Q6 is No        Assessment and Plan: Patient endorses symptoms of anxiety, depression, auditory hallucinations and paranoia.  Today she is agreeable to increasing Seroquel 25 mg twice daily to 50 mg twice daily.  She is also agreeable to increasing trazodone 50 mg to 100 mg to help manage sleep.  Provider sent referral to [facility] long sleep center.  Patient is a resident of [facility] and will follow-up with another provider for future appointment.  1. Schizoaffective disorder, bipolar type (HCC)  Continue- divalproex (DEPAKOTE ER) 250 MG 24 hr tablet; Take 1 tablet (250 mg total) by mouth 2 (two) times daily.  Dispense: 60 tablet; Refill: 3 Continue- escitalopram (LEXAPRO) 20 MG tablet; Take 1 tablet (20 mg total) by mouth daily.  Dispense: 30 tablet; Refill: 3 Continue- gabapentin (NEURONTIN) 300 MG capsule; Take 1 capsule (300 mg total) by mouth 3 (three) times daily.  Dispense: 90 capsule; Refill: 3 Continue- QUEtiapine (SEROQUEL XR) 400 MG 24 hr tablet; Take 1 tablet (400 mg total) by mouth at bedtime.  Dispense: 30 tablet; Refill: 3 Increased- QUEtiapine (SEROQUEL) 50 MG tablet; Take 1 tablet (50 mg total) by mouth  2 (two) times daily.  Dispense: 60 tablet; Refill: 3  2. PTSD (post-traumatic stress disorder)  Continue- escitalopram (LEXAPRO) 20 MG tablet; Take 1 tablet (20  mg total) by mouth daily.  Dispense: 30 tablet; Refill: 3  3. Generalized anxiety disorder  Continue- escitalopram (LEXAPRO) 20 MG tablet; Take 1 tablet (20 mg total) by mouth daily.  Dispense: 30 tablet; Refill: 3 Continue- hydrOXYzine (ATARAX) 50 MG tablet; Take 1 tablet (50 mg total) by mouth 3 (three) times daily as needed.  Dispense: 90 tablet; Refill: 3 Continue- QUEtiapine (SEROQUEL XR) 400 MG 24 hr tablet; Take 1 tablet (400 mg total) by mouth at bedtime.  Dispense: 30 tablet; Refill: 3  increased- QUEtiapine (SEROQUEL) 50 MG tablet; Take 1 tablet (50 mg total) by mouth 2 (two) times daily.  Dispense: 60 tablet; Refill: 3  4. Insomnia due to other mental disorder  - Polysomnography 4 or more parameters; Future - Ambulatory referral to Sleep Studies    Collaboration of Care: Collaboration of Care: Other patient will follow-up with [facility] providers  Patient/Guardian was advised Release of Information must be obtained prior to any record release in order to collaborate their care with an outside provider. Patient/Guardian was advised if they have not already done so to contact the registration department to sign all necessary forms in order for [place] to release information regarding their care.   Consent: Patient/Guardian gives verbal consent for treatment and assignment of benefits for services provided during this visit. Patient/Guardian expressed understanding and agreed to proceed.   Follow-up with [facility] provider Shanna Cisco, NP 03/04/2022, 9:48 AM

## 2022-03-07 ENCOUNTER — Ambulatory Visit (HOSPITAL_COMMUNITY): Payer: Medicaid Other

## 2022-04-04 ENCOUNTER — Inpatient Hospital Stay (HOSPITAL_COMMUNITY): Admission: RE | Admit: 2022-04-04 | Payer: Medicaid Other | Source: Ambulatory Visit

## 2022-04-18 ENCOUNTER — Ambulatory Visit (HOSPITAL_COMMUNITY): Payer: Medicaid Other

## 2022-07-25 ENCOUNTER — Telehealth (HOSPITAL_COMMUNITY): Payer: Self-pay | Admitting: *Deleted

## 2022-07-25 NOTE — Telephone Encounter (Signed)
Fax request from [facility] for patietns depakote, she hasnt been seen since May, should be a patient of [place] office as she lives in [place]. She will need to be seen by a provider before any future rxs will be done.

## 2022-07-29 ENCOUNTER — Telehealth (HOSPITAL_COMMUNITY): Payer: Self-pay | Admitting: *Deleted

## 2022-07-29 ENCOUNTER — Telehealth (HOSPITAL_COMMUNITY): Payer: Self-pay

## 2022-07-29 ENCOUNTER — Other Ambulatory Visit (HOSPITAL_COMMUNITY): Payer: Self-pay | Admitting: Psychiatry

## 2022-07-29 DIAGNOSIS — F250 Schizoaffective disorder, bipolar type: Secondary | ICD-10-CM

## 2022-07-29 DIAGNOSIS — F411 Generalized anxiety disorder: Secondary | ICD-10-CM

## 2022-07-29 DIAGNOSIS — F4310 Post-traumatic stress disorder, unspecified: Secondary | ICD-10-CM

## 2022-07-29 MED ORDER — DIVALPROEX SODIUM ER 250 MG PO TB24: 250.0000 mg | ORAL_TABLET | Freq: Two times a day (BID) | ORAL | 3 refills | Status: DC

## 2022-07-29 MED ORDER — HYDROXYZINE HCL 50 MG PO TABS: 50.0000 mg | ORAL_TABLET | Freq: Three times a day (TID) | ORAL | 3 refills | Status: DC | PRN

## 2022-07-29 MED ORDER — QUETIAPINE FUMARATE ER 400 MG PO TB24: 400.0000 mg | ORAL_TABLET | Freq: Every day | ORAL | 3 refills | Status: DC

## 2022-07-29 MED ORDER — TRAZODONE HCL 100 MG PO TABS: 100.0000 mg | ORAL_TABLET | Freq: Every evening | ORAL | 3 refills | Status: DC | PRN

## 2022-07-29 MED ORDER — QUETIAPINE FUMARATE 50 MG PO TABS: 50.0000 mg | ORAL_TABLET | Freq: Two times a day (BID) | ORAL | 3 refills | Status: DC

## 2022-07-29 MED ORDER — GABAPENTIN 300 MG PO CAPS: 300.0000 mg | ORAL_CAPSULE | Freq: Three times a day (TID) | ORAL | 3 refills | Status: DC

## 2022-07-29 MED ORDER — ESCITALOPRAM OXALATE 20 MG PO TABS: 20.0000 mg | ORAL_TABLET | Freq: Every day | ORAL | 3 refills | Status: DC

## 2022-07-29 NOTE — Telephone Encounter (Signed)
Called pt to schedule np appt with Dr Nehemiah Settle per rn at [address] [place] no answer vm not set up

## 2022-07-29 NOTE — Telephone Encounter (Signed)
Medication refilled and sent to preferred pharmacy

## 2022-07-29 NOTE — Telephone Encounter (Signed)
Fax request from pharmacy in [place] for her Divalproex. She should be seen in [place] office as she lives in [place] and has medicaid. I spoke with Alison Murray, supervisor and she is asking [place] to transfer her to [facility] due to address and that [facility] is moving her patients as she is taking on a new responsibility here. Will ask there staff to give her a future appt and ask Brittney to bridge her till this appt.

## 2022-07-29 NOTE — Telephone Encounter (Signed)
In contact with Paula Medina. Scheduling at the [facility] office. Meds were sent in for her per Brittney but Laural Roes has been unable to make contact with her to schedule a future appt. Patient has not answered phone to make needed change.

## 2022-07-31 NOTE — Telephone Encounter (Signed)
Pt scheduled 08/12/22

## 2022-08-12 ENCOUNTER — Ambulatory Visit (HOSPITAL_COMMUNITY): Payer: Medicaid Other | Admitting: Psychiatry

## 2022-08-14 ENCOUNTER — Other Ambulatory Visit (HOSPITAL_COMMUNITY): Payer: Self-pay | Admitting: Psychiatry

## 2022-08-14 ENCOUNTER — Telehealth (HOSPITAL_COMMUNITY): Payer: Self-pay

## 2022-08-14 DIAGNOSIS — F250 Schizoaffective disorder, bipolar type: Secondary | ICD-10-CM

## 2022-08-14 DIAGNOSIS — F411 Generalized anxiety disorder: Secondary | ICD-10-CM

## 2022-08-14 MED ORDER — QUETIAPINE FUMARATE ER 400 MG PO TB24: 400.0000 mg | ORAL_TABLET | Freq: Every day | ORAL | 3 refills | Status: DC

## 2022-08-14 NOTE — Telephone Encounter (Signed)
Medication refilled and sent to preferred pharmacy

## 2022-08-14 NOTE — Telephone Encounter (Signed)
br

## 2022-08-25 ENCOUNTER — Other Ambulatory Visit (HOSPITAL_COMMUNITY): Payer: Self-pay | Admitting: Psychiatry

## 2022-08-25 ENCOUNTER — Ambulatory Visit (HOSPITAL_COMMUNITY): Payer: Medicaid Other | Admitting: Psychiatry

## 2022-08-25 DIAGNOSIS — F250 Schizoaffective disorder, bipolar type: Secondary | ICD-10-CM

## 2022-08-25 DIAGNOSIS — F411 Generalized anxiety disorder: Secondary | ICD-10-CM

## 2022-08-27 ENCOUNTER — Encounter (HOSPITAL_COMMUNITY): Payer: Self-pay | Admitting: Psychiatry

## 2022-08-27 ENCOUNTER — Ambulatory Visit (INDEPENDENT_AMBULATORY_CARE_PROVIDER_SITE_OTHER): Payer: Medicaid Other | Admitting: Psychiatry

## 2022-08-27 DIAGNOSIS — F1411 Cocaine abuse, in remission: Secondary | ICD-10-CM

## 2022-08-27 DIAGNOSIS — F4310 Post-traumatic stress disorder, unspecified: Secondary | ICD-10-CM | POA: Insufficient documentation

## 2022-08-27 DIAGNOSIS — I10 Essential (primary) hypertension: Secondary | ICD-10-CM | POA: Insufficient documentation

## 2022-08-27 DIAGNOSIS — G8929 Other chronic pain: Secondary | ICD-10-CM | POA: Insufficient documentation

## 2022-08-27 DIAGNOSIS — F4001 Agoraphobia with panic disorder: Secondary | ICD-10-CM | POA: Diagnosis not present

## 2022-08-27 DIAGNOSIS — F5105 Insomnia due to other mental disorder: Secondary | ICD-10-CM | POA: Diagnosis not present

## 2022-08-27 DIAGNOSIS — R519 Headache, unspecified: Secondary | ICD-10-CM | POA: Insufficient documentation

## 2022-08-27 DIAGNOSIS — F411 Generalized anxiety disorder: Secondary | ICD-10-CM | POA: Diagnosis not present

## 2022-08-27 DIAGNOSIS — F1211 Cannabis abuse, in remission: Secondary | ICD-10-CM | POA: Diagnosis not present

## 2022-08-27 DIAGNOSIS — G4700 Insomnia, unspecified: Secondary | ICD-10-CM | POA: Insufficient documentation

## 2022-08-27 DIAGNOSIS — F5081 Binge eating disorder: Secondary | ICD-10-CM | POA: Insufficient documentation

## 2022-08-27 DIAGNOSIS — G894 Chronic pain syndrome: Secondary | ICD-10-CM | POA: Insufficient documentation

## 2022-08-27 DIAGNOSIS — Z79899 Other long term (current) drug therapy: Secondary | ICD-10-CM | POA: Insufficient documentation

## 2022-08-27 DIAGNOSIS — F99 Mental disorder, not otherwise specified: Secondary | ICD-10-CM

## 2022-08-27 DIAGNOSIS — S069X9A Unspecified intracranial injury with loss of consciousness of unspecified duration, initial encounter: Secondary | ICD-10-CM | POA: Insufficient documentation

## 2022-08-27 HISTORY — DX: Chronic pain syndrome: G89.4

## 2022-08-27 NOTE — Patient Instructions (Signed)
We discontinued your hydroxyzine today. Once you get your depakote level drawn on Monday morning (be sure not to take your depakote until after the blood draw) we will plan on increasing your depakote if the blood level is low. Over time, we will continue to make adjustments to your medication to decrease your medication burden. Please go by your PCP's office to get an ecg and have them fax the results over to our clinic.
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
## 2022-09-01 ENCOUNTER — Other Ambulatory Visit (HOSPITAL_COMMUNITY): Payer: Self-pay | Admitting: Psychiatry

## 2022-09-02 LAB — LIPID PANEL
Cholesterol: 204 mg/dL — ABNORMAL HIGH (ref <200)
HDL: 44 mg/dL — ABNORMAL LOW (ref >=50)
LDL Cholesterol (Calc): 128 mg/dL (calc) — ABNORMAL HIGH
Non-HDL Cholesterol (Calc): 160 mg/dL (calc) — ABNORMAL HIGH (ref <130)
Total CHOL/HDL Ratio: 4.6 (calc) (ref <5.0)
Triglycerides: 188 mg/dL — ABNORMAL HIGH (ref <150)

## 2022-09-02 LAB — CBC
HCT: 37.9 % (ref 35.0–45.0)
Hemoglobin: 13 g/dL (ref 11.7–15.5)
MCH: 29.6 pg (ref 27.0–33.0)
MCHC: 34.3 g/dL (ref 32.0–36.0)
MCV: 86.3 fL (ref 80.0–100.0)
MPV: 11.3 fL (ref 7.5–12.5)
Platelets: 239 10*3/uL (ref 140–400)
RBC: 4.39 10*6/uL (ref 3.80–5.10)
RDW: 13.8 % (ref 11.0–15.0)
WBC: 7 10*3/uL (ref 3.8–10.8)

## 2022-09-02 LAB — VALPROIC ACID LEVEL: Valproic Acid Lvl: <12.5 mg/L — ABNORMAL LOW (ref 50.0–100.0)

## 2022-09-02 LAB — HEMOGLOBIN A1C
Hgb A1c MFr Bld: 5.7 % of total Hgb — ABNORMAL HIGH (ref <5.7)
Mean Plasma Glucose: 117 mg/dL
eAG (mmol/L): 6.5 mmol/L

## 2022-09-02 LAB — COMPLETE METABOLIC PANEL WITH GFR
AG Ratio: 1.6 (calc) (ref 1.0–2.5)
ALT: 16 U/L (ref 6–29)
AST: 12 U/L (ref 10–35)
Albumin: 4.3 g/dL (ref 3.6–5.1)
Alkaline phosphatase (APISO): 43 U/L (ref 31–125)
BUN: 10 mg/dL (ref 7–25)
CO2: 27 mmol/L (ref 20–32)
Calcium: 8.9 mg/dL (ref 8.6–10.2)
Chloride: 104 mmol/L (ref 98–110)
Creat: 0.78 mg/dL (ref 0.50–0.99)
Globulin: 2.7 g/dL (calc) (ref 1.9–3.7)
Glucose, Bld: 94 mg/dL (ref 65–99)
Potassium: 3.8 mmol/L (ref 3.5–5.3)
Sodium: 141 mmol/L (ref 135–146)
Total Bilirubin: 0.4 mg/dL (ref 0.2–1.2)
Total Protein: 7 g/dL (ref 6.1–8.1)
eGFR: 94 mL/min/{1.73_m2} (ref >=60)

## 2022-09-11 ENCOUNTER — Telehealth (INDEPENDENT_AMBULATORY_CARE_PROVIDER_SITE_OTHER): Payer: Medicaid Other | Admitting: Psychiatry

## 2022-09-11 ENCOUNTER — Encounter (HOSPITAL_COMMUNITY): Payer: Self-pay | Admitting: Psychiatry

## 2022-09-11 DIAGNOSIS — Z79899 Other long term (current) drug therapy: Secondary | ICD-10-CM

## 2022-09-11 DIAGNOSIS — F250 Schizoaffective disorder, bipolar type: Secondary | ICD-10-CM

## 2022-09-11 DIAGNOSIS — F5105 Insomnia due to other mental disorder: Secondary | ICD-10-CM | POA: Diagnosis not present

## 2022-09-11 DIAGNOSIS — F4001 Agoraphobia with panic disorder: Secondary | ICD-10-CM

## 2022-09-11 DIAGNOSIS — G894 Chronic pain syndrome: Secondary | ICD-10-CM

## 2022-09-11 DIAGNOSIS — F411 Generalized anxiety disorder: Secondary | ICD-10-CM

## 2022-09-11 DIAGNOSIS — F99 Mental disorder, not otherwise specified: Secondary | ICD-10-CM

## 2022-09-11 DIAGNOSIS — F5081 Binge eating disorder: Secondary | ICD-10-CM

## 2022-09-11 DIAGNOSIS — S069X9A Unspecified intracranial injury with loss of consciousness of unspecified duration, initial encounter: Secondary | ICD-10-CM

## 2022-09-11 MED ORDER — PALIPERIDONE ER 3 MG PO TB24: ORAL_TABLET | ORAL | 1 refills | Status: DC

## 2022-09-11 MED ORDER — GABAPENTIN 300 MG PO CAPS: 300.0000 mg | ORAL_CAPSULE | Freq: Three times a day (TID) | ORAL | 3 refills | Status: DC

## 2022-09-11 MED ORDER — TRAZODONE HCL 100 MG PO TABS: 100.0000 mg | ORAL_TABLET | Freq: Every evening | ORAL | 1 refills | Status: DC | PRN

## 2022-09-11 MED ORDER — DIVALPROEX SODIUM ER 500 MG PO TB24: 500.0000 mg | ORAL_TABLET | Freq: Two times a day (BID) | ORAL | 1 refills | Status: DC

## 2022-09-11 NOTE — Addendum Note (Signed)
Addended by: Tia Masker on: 09/11/2022 11:12 AM   Modules accepted: Orders
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
## 2022-09-11 NOTE — Patient Instructions (Addendum)
We discontinued your robaxin and lexapro since you haven't been taking them. We also stopped your seroquel in order to start paliperidone (invega) with plan to eventually do a long acting injectable shot. Start paliperidone 3mg  daily x1 week then increase to 6mg  daily x1 then increase to 9mg  daily. We also changed your depakote to 500mg  once nightly. Please go by your PCP's office to get your ecg done and take the medication they are giving you for your heart and cholesterol.

## 2022-10-02 ENCOUNTER — Encounter (HOSPITAL_COMMUNITY): Payer: Self-pay | Admitting: Psychiatry

## 2022-10-02 ENCOUNTER — Telehealth (INDEPENDENT_AMBULATORY_CARE_PROVIDER_SITE_OTHER): Payer: Medicaid Other | Admitting: Psychiatry

## 2022-10-02 ENCOUNTER — Telehealth (HOSPITAL_COMMUNITY): Payer: Self-pay | Admitting: *Deleted

## 2022-10-02 DIAGNOSIS — Z789 Other specified health status: Secondary | ICD-10-CM | POA: Diagnosis not present

## 2022-10-02 DIAGNOSIS — F50819 Binge eating disorder, unspecified: Secondary | ICD-10-CM

## 2022-10-02 DIAGNOSIS — F4310 Post-traumatic stress disorder, unspecified: Secondary | ICD-10-CM | POA: Diagnosis not present

## 2022-10-02 DIAGNOSIS — F411 Generalized anxiety disorder: Secondary | ICD-10-CM | POA: Diagnosis not present

## 2022-10-02 DIAGNOSIS — F5081 Binge eating disorder: Secondary | ICD-10-CM

## 2022-10-02 DIAGNOSIS — S069X9A Unspecified intracranial injury with loss of consciousness of unspecified duration, initial encounter: Secondary | ICD-10-CM

## 2022-10-02 DIAGNOSIS — F4001 Agoraphobia with panic disorder: Secondary | ICD-10-CM

## 2022-10-02 DIAGNOSIS — F250 Schizoaffective disorder, bipolar type: Secondary | ICD-10-CM | POA: Diagnosis not present

## 2022-10-02 DIAGNOSIS — Z79899 Other long term (current) drug therapy: Secondary | ICD-10-CM

## 2022-10-02 DIAGNOSIS — F19982 Other psychoactive substance use, unspecified with psychoactive substance-induced sleep disorder: Secondary | ICD-10-CM

## 2022-10-02 HISTORY — DX: Other specified health status: Z78.9

## 2022-10-02 MED ORDER — PALIPERIDONE PALMITATE ER 117 MG/0.75ML IM SUSY: 117.0000 mg | PREFILLED_SYRINGE | INTRAMUSCULAR | 3 refills | Status: DC

## 2022-10-02 MED ORDER — PALIPERIDONE PALMITATE ER 156 MG/ML IM SUSY
156.0000 mg | PREFILLED_SYRINGE | Freq: Once | INTRAMUSCULAR | Status: AC
  Administered 2022-10-28: 156 mg via INTRAMUSCULAR

## 2022-10-02 MED ORDER — PALIPERIDONE PALMITATE ER 234 MG/1.5ML IM SUSY
234.0000 mg | PREFILLED_SYRINGE | Freq: Once | INTRAMUSCULAR | Status: AC
  Administered 2022-10-21: 234 mg via INTRAMUSCULAR

## 2022-10-02 MED ORDER — PALIPERIDONE PALMITATE ER 234 MG/1.5ML IM SUSY: 234.0000 mg | PREFILLED_SYRINGE | Freq: Once | INTRAMUSCULAR | 0 refills | Status: DC

## 2022-10-02 MED ORDER — PALIPERIDONE PALMITATE ER 117 MG/0.75ML IM SUSY
117.0000 mg | PREFILLED_SYRINGE | Freq: Once | INTRAMUSCULAR | Status: AC
  Administered 2022-11-27: 117 mg via INTRAMUSCULAR

## 2022-10-02 MED ORDER — PALIPERIDONE PALMITATE ER 156 MG/ML IM SUSY: 156.0000 mg | PREFILLED_SYRINGE | Freq: Once | INTRAMUSCULAR | 0 refills | Status: DC
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
## 2022-10-02 NOTE — Addendum Note (Signed)
Addended by: Tia Masker on: 10/02/2022 12:27 PM   Modules accepted: Orders

## 2022-10-02 NOTE — Telephone Encounter (Signed)
Per Dr. Adrian Blackwater,  in 2 weeks administer 234mg  loading dose of invega LAI on 10/16/22,   then 156mg  LAI dose one week later on 10/23/22,   and 117mg  monthly dose 1 month after on 11/20/22.    I've sent in the prescriptions which will each be available on the Monday before he injection which I've ordered for 10a on the days above.    Staff tried calling patient on all numbers in patient chart and was not able to reach patient or leave a message. Staff went ahead and scheduled appointments and will try calling patient again to make sure she is aware of the appointments.

## 2022-10-16 ENCOUNTER — Other Ambulatory Visit (HOSPITAL_COMMUNITY): Payer: Self-pay | Admitting: Psychiatry

## 2022-10-16 ENCOUNTER — Ambulatory Visit (HOSPITAL_COMMUNITY): Payer: Medicaid Other | Admitting: Psychiatry

## 2022-10-16 DIAGNOSIS — F411 Generalized anxiety disorder: Secondary | ICD-10-CM

## 2022-10-17 ENCOUNTER — Ambulatory Visit (HOSPITAL_COMMUNITY): Payer: Medicaid Other | Admitting: Psychiatry

## 2022-10-17 ENCOUNTER — Other Ambulatory Visit (HOSPITAL_COMMUNITY): Payer: Self-pay | Admitting: Psychiatry

## 2022-10-17 DIAGNOSIS — S069X9A Unspecified intracranial injury with loss of consciousness of unspecified duration, initial encounter: Secondary | ICD-10-CM

## 2022-10-17 DIAGNOSIS — F99 Mental disorder, not otherwise specified: Secondary | ICD-10-CM

## 2022-10-17 DIAGNOSIS — F250 Schizoaffective disorder, bipolar type: Secondary | ICD-10-CM

## 2022-10-17 DIAGNOSIS — F4001 Agoraphobia with panic disorder: Secondary | ICD-10-CM

## 2022-10-17 DIAGNOSIS — F5105 Insomnia due to other mental disorder: Secondary | ICD-10-CM

## 2022-10-17 DIAGNOSIS — G894 Chronic pain syndrome: Secondary | ICD-10-CM

## 2022-10-20 ENCOUNTER — Ambulatory Visit (HOSPITAL_COMMUNITY): Payer: Medicaid Other | Admitting: Psychiatry

## 2022-10-21 ENCOUNTER — Ambulatory Visit (INDEPENDENT_AMBULATORY_CARE_PROVIDER_SITE_OTHER): Payer: Medicaid Other

## 2022-10-21 ENCOUNTER — Encounter (HOSPITAL_COMMUNITY): Payer: Self-pay

## 2022-10-21 DIAGNOSIS — F250 Schizoaffective disorder, bipolar type: Secondary | ICD-10-CM | POA: Diagnosis not present

## 2022-10-21 MED ORDER — PALIPERIDONE PALMITATE ER 234 MG/1.5ML IM SUSY: 234.0000 mg | PREFILLED_SYRINGE | Freq: Once | INTRAMUSCULAR | 0 refills | Status: DC

## 2022-10-21 NOTE — Progress Notes (Signed)
Patient presented to clinic with flat affect, level and pleasant mood after picking up correct starting dosage of Invega Sustenna 234 mg IM injection medication this date.  Patient reported no problems with po Paliperidone 3 mg, 3 times a day except for some sedation.  Patient reported positive auditory hallucinations at times, no visual hallucinations and no suicidal or homicidal ideations, plans, intent, or means to want to harm self or others at this time.  Patient's initial dosage of Paliperidone (Invega Sustenna) 234 MG IM injection prepared as ordered and given to patient in her preferred right deltoid area.  Patient tolerated the due injection without any complaint of pain, discomfort, or other symptoms.  Patient with no adverse reactions noted from her first injection or noted and patient agreed to go to the emergency department if any reaction with shortness of breath or other symptoms and to call if any issues.  Patient agreed to return in one week with second loading dosage of Invega Sustenna 156 mg IM to receive that day and then patient will go to monthly IM injections of the 117 mg dosage.  Patient to continue all current medications as prescribed and to call if any issues.

## 2022-10-23 ENCOUNTER — Ambulatory Visit (HOSPITAL_COMMUNITY): Payer: Medicaid Other | Admitting: Psychiatry

## 2022-10-28 ENCOUNTER — Ambulatory Visit (INDEPENDENT_AMBULATORY_CARE_PROVIDER_SITE_OTHER): Payer: Medicaid Other | Admitting: *Deleted

## 2022-10-28 VITALS — BP 106/72 | HR 98 | Ht 66.0 in | Wt 210.0 lb

## 2022-10-28 DIAGNOSIS — F250 Schizoaffective disorder, bipolar type: Secondary | ICD-10-CM

## 2022-10-28 NOTE — Patient Instructions (Signed)
Follow-up in 4 weeks

## 2022-10-28 NOTE — Progress Notes (Signed)
Patient came into office to get her Kirt Boys 156mg /mL injection. Injection was given to her in the Left Deltoid. Patient did not have any C/O for this injection she received today. Per pt the previous injection made her arm sore for a couple days but other then that, she did not have any further c/o.

## 2022-11-05 ENCOUNTER — Telehealth (INDEPENDENT_AMBULATORY_CARE_PROVIDER_SITE_OTHER): Payer: Medicaid Other | Admitting: Psychiatry

## 2022-11-05 ENCOUNTER — Encounter (HOSPITAL_COMMUNITY): Payer: Self-pay | Admitting: Psychiatry

## 2022-11-05 DIAGNOSIS — F99 Mental disorder, not otherwise specified: Secondary | ICD-10-CM

## 2022-11-05 DIAGNOSIS — Z789 Other specified health status: Secondary | ICD-10-CM

## 2022-11-05 DIAGNOSIS — Z79899 Other long term (current) drug therapy: Secondary | ICD-10-CM

## 2022-11-05 DIAGNOSIS — G894 Chronic pain syndrome: Secondary | ICD-10-CM

## 2022-11-05 DIAGNOSIS — F250 Schizoaffective disorder, bipolar type: Secondary | ICD-10-CM

## 2022-11-05 DIAGNOSIS — F5105 Insomnia due to other mental disorder: Secondary | ICD-10-CM

## 2022-11-05 DIAGNOSIS — F19982 Other psychoactive substance use, unspecified with psychoactive substance-induced sleep disorder: Secondary | ICD-10-CM | POA: Diagnosis not present

## 2022-11-05 DIAGNOSIS — F4310 Post-traumatic stress disorder, unspecified: Secondary | ICD-10-CM

## 2022-11-05 DIAGNOSIS — F5081 Binge eating disorder: Secondary | ICD-10-CM

## 2022-11-05 DIAGNOSIS — F4001 Agoraphobia with panic disorder: Secondary | ICD-10-CM

## 2022-11-05 DIAGNOSIS — S069X9A Unspecified intracranial injury with loss of consciousness of unspecified duration, initial encounter: Secondary | ICD-10-CM

## 2022-11-05 DIAGNOSIS — F411 Generalized anxiety disorder: Secondary | ICD-10-CM

## 2022-11-05 MED ORDER — DIVALPROEX SODIUM ER 500 MG PO TB24: 500.0000 mg | ORAL_TABLET | Freq: Two times a day (BID) | ORAL | 1 refills | Status: DC
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
## 2022-11-05 NOTE — Patient Instructions (Signed)
Please continue to try to cut back on the amount of caffeine you are consuming daily.  This should make it much easier to sleep at night.  If you can also stop napping during the day that will also make it easier to sleep during the night.  We discontinued your Invega pills today so stopped taking those and this should also make it easier to stay awake during the day.  You appeared to be responding well to the long-acting injectable and please come by the clinic to have a Depakote level drawn in the morning before you take your morning dose.  If you can establish with a new PCP within this building then we can get an EKG checked as well as his lipid panel and A1c.

## 2022-11-06 ENCOUNTER — Other Ambulatory Visit (HOSPITAL_COMMUNITY): Payer: Self-pay | Admitting: Psychiatry

## 2022-11-06 DIAGNOSIS — F411 Generalized anxiety disorder: Secondary | ICD-10-CM

## 2022-11-07 LAB — VALPROIC ACID LEVEL: Valproic Acid Lvl: 50.4 mg/L (ref 50.0–100.0)

## 2022-11-10 ENCOUNTER — Other Ambulatory Visit (HOSPITAL_COMMUNITY): Payer: Self-pay | Admitting: Psychiatry

## 2022-11-10 DIAGNOSIS — F411 Generalized anxiety disorder: Secondary | ICD-10-CM

## 2022-11-20 ENCOUNTER — Ambulatory Visit (HOSPITAL_COMMUNITY): Payer: Medicaid Other | Admitting: Psychiatry

## 2022-11-24 ENCOUNTER — Ambulatory Visit (INDEPENDENT_AMBULATORY_CARE_PROVIDER_SITE_OTHER): Payer: Medicaid Other | Admitting: Family Medicine

## 2022-11-24 ENCOUNTER — Encounter: Payer: Self-pay | Admitting: Family Medicine

## 2022-11-24 ENCOUNTER — Encounter: Payer: Self-pay | Admitting: *Deleted

## 2022-11-24 VITALS — BP 140/98 | HR 88 | Ht 66.0 in | Wt 218.0 lb

## 2022-11-24 DIAGNOSIS — Z114 Encounter for screening for human immunodeficiency virus [HIV]: Secondary | ICD-10-CM

## 2022-11-24 DIAGNOSIS — Z1159 Encounter for screening for other viral diseases: Secondary | ICD-10-CM

## 2022-11-24 DIAGNOSIS — G8929 Other chronic pain: Secondary | ICD-10-CM

## 2022-11-24 DIAGNOSIS — Z1211 Encounter for screening for malignant neoplasm of colon: Secondary | ICD-10-CM

## 2022-11-24 DIAGNOSIS — R7301 Impaired fasting glucose: Secondary | ICD-10-CM

## 2022-11-24 DIAGNOSIS — F1411 Cocaine abuse, in remission: Secondary | ICD-10-CM

## 2022-11-24 DIAGNOSIS — M5441 Lumbago with sciatica, right side: Secondary | ICD-10-CM

## 2022-11-24 DIAGNOSIS — S069X9A Unspecified intracranial injury with loss of consciousness of unspecified duration, initial encounter: Secondary | ICD-10-CM

## 2022-11-24 DIAGNOSIS — Z1231 Encounter for screening mammogram for malignant neoplasm of breast: Secondary | ICD-10-CM

## 2022-11-24 DIAGNOSIS — E7841 Elevated Lipoprotein(a): Secondary | ICD-10-CM

## 2022-11-24 DIAGNOSIS — F250 Schizoaffective disorder, bipolar type: Secondary | ICD-10-CM

## 2022-11-24 DIAGNOSIS — I10 Essential (primary) hypertension: Secondary | ICD-10-CM

## 2022-11-24 MED ORDER — CYCLOBENZAPRINE HCL 5 MG PO TABS: 5.0000 mg | ORAL_TABLET | Freq: Three times a day (TID) | ORAL | 1 refills | Status: DC | PRN

## 2022-11-24 MED ORDER — AMLODIPINE BESYLATE 10 MG PO TABS: 10.0000 mg | ORAL_TABLET | Freq: Every day | ORAL | 2 refills | Status: DC

## 2022-11-24 MED ORDER — GABAPENTIN 400 MG PO CAPS: 400.0000 mg | ORAL_CAPSULE | Freq: Three times a day (TID) | ORAL | 2 refills | Status: DC

## 2022-11-24 NOTE — Assessment & Plan Note (Addendum)
Straight leg raised performed caused patient shooting pain to the right side back and thig. Increased Gabapentin dose to 400 mg Flexeril 5 mg to be taken at night  Discussed medication desired effects, potential side effects. Explained non pharmacological interventions include rest, avoid twisting, straining lower back. Demonstration of proper body mechanics. Alternate ice and heat. Recommend stretching back and legs. Follow up for worsening or persistent symptoms. Patient verbalizes understanding regarding plan of care and all questions answered.

## 2022-11-24 NOTE — Patient Instructions (Signed)
It was pleasure meeting with you today. Please take medications as prescribed. Follow up with your primary health provider if any health concerns arises. If symptoms worsen please contact your primary care provider and/or visit the emergency department.  Follow in 2 weeks for weight loss management Follow up in 1 month for pap smear Follow up in 3 months for chronic conditions Hypertension, Hyperlipidemia

## 2022-11-24 NOTE — Assessment & Plan Note (Addendum)
Blood pressure slightly elevated 137/94, 140/98, Labs ordered  Increased amlodipine to 10 mg daily , continue losartan- HCTZ 100-25 mg, Discussed medication desired effects, potential side effects, and how to administer the medication. Explained non pharmacological interventions such as low salt, DASH diet discussed. Educated on stress reduction and physical activity. Discussed signs and symptoms of major cardiovascular event and need to present to the ED. Follow up in 3 months or sooner if needed. Patient verbalizes understanding regarding plan of care and all questions answered.
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
## 2022-11-27 ENCOUNTER — Encounter (HOSPITAL_COMMUNITY): Payer: Self-pay | Admitting: Psychiatry

## 2022-11-27 ENCOUNTER — Telehealth (HOSPITAL_COMMUNITY): Payer: Self-pay | Admitting: *Deleted

## 2022-11-27 ENCOUNTER — Ambulatory Visit (INDEPENDENT_AMBULATORY_CARE_PROVIDER_SITE_OTHER): Payer: No Typology Code available for payment source | Admitting: *Deleted

## 2022-11-27 VITALS — BP 118/81 | HR 93 | Ht 66.0 in | Wt 213.4 lb

## 2022-11-27 DIAGNOSIS — F250 Schizoaffective disorder, bipolar type: Secondary | ICD-10-CM | POA: Diagnosis not present

## 2022-11-27 NOTE — Telephone Encounter (Signed)
Per pt she is not sleeping like she is suppose to sleep. Per pt she don't think the medication is helping with her sleep because she is not noticing any different. Informed patient information will be given to provider.

## 2022-11-27 NOTE — Patient Instructions (Signed)
Follow-up in 4 weeks

## 2022-11-27 NOTE — Progress Notes (Signed)
Patient came into office to get her Kirt Boys 185m/mL injection. Injection was given to her in the Right Deltoid. Patient did not have any C/O for this injection she received today and from previous injection.    Per pt she is not sleeping like she is suppose to sleep. Per pt she don't think the medication is helping with her sleep because she is not noticing any different. Informed patient information will be given to provider

## 2022-11-27 NOTE — Telephone Encounter (Signed)
Per pt she stopped sleeping during the day and she stopped drinking soda's since her PCP told her she was gaining weight and have diabetes

## 2022-12-04 ENCOUNTER — Telehealth (HOSPITAL_COMMUNITY): Payer: No Typology Code available for payment source | Admitting: Psychiatry

## 2022-12-04 ENCOUNTER — Encounter: Payer: Self-pay | Admitting: Family Medicine

## 2022-12-04 ENCOUNTER — Encounter (HOSPITAL_COMMUNITY): Payer: Self-pay | Admitting: Psychiatry

## 2022-12-04 DIAGNOSIS — F411 Generalized anxiety disorder: Secondary | ICD-10-CM

## 2022-12-04 DIAGNOSIS — S069X9A Unspecified intracranial injury with loss of consciousness of unspecified duration, initial encounter: Secondary | ICD-10-CM

## 2022-12-04 DIAGNOSIS — F5081 Binge eating disorder: Secondary | ICD-10-CM

## 2022-12-04 DIAGNOSIS — M549 Dorsalgia, unspecified: Secondary | ICD-10-CM

## 2022-12-04 DIAGNOSIS — F5105 Insomnia due to other mental disorder: Secondary | ICD-10-CM | POA: Diagnosis not present

## 2022-12-04 DIAGNOSIS — F4001 Agoraphobia with panic disorder: Secondary | ICD-10-CM

## 2022-12-04 DIAGNOSIS — F4310 Post-traumatic stress disorder, unspecified: Secondary | ICD-10-CM

## 2022-12-04 DIAGNOSIS — F99 Mental disorder, not otherwise specified: Secondary | ICD-10-CM

## 2022-12-04 DIAGNOSIS — F250 Schizoaffective disorder, bipolar type: Secondary | ICD-10-CM | POA: Diagnosis not present

## 2022-12-04 DIAGNOSIS — Z79899 Other long term (current) drug therapy: Secondary | ICD-10-CM

## 2022-12-04 DIAGNOSIS — F50819 Binge eating disorder, unspecified: Secondary | ICD-10-CM

## 2022-12-04 DIAGNOSIS — G8929 Other chronic pain: Secondary | ICD-10-CM

## 2022-12-04 MED ORDER — PALIPERIDONE PALMITATE ER 117 MG/0.75ML IM SUSY
117.0000 mg | PREFILLED_SYRINGE | INTRAMUSCULAR | Status: DC
  Administered 2022-12-29 – 2023-07-09 (×7): 117 mg via INTRAMUSCULAR

## 2022-12-04 MED ORDER — DIVALPROEX SODIUM ER 500 MG PO TB24: ORAL_TABLET | ORAL | 1 refills | Status: DC

## 2022-12-04 MED ORDER — PALIPERIDONE PALMITATE ER 117 MG/0.75ML IM SUSY: 117.0000 mg | PREFILLED_SYRINGE | INTRAMUSCULAR | 3 refills | Status: DC

## 2022-12-04 NOTE — Patient Instructions (Signed)
We changed her medication today.  You can stop taking the hydroxyzine at this point.  The gabapentin that your PCP is prescribing should be pretty effective for any anxiety that she be having during the day.  Also take one of the Depakote (500 mg total) in the morning and take 2 pills (1000 mg total) at night.  When you come for your next shot do not take your Depakote that morning until you get the blood level drawn.  Keep up the good work with cutting out the soda it should make it easier and easier to sleep at night.
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
## 2022-12-09 ENCOUNTER — Ambulatory Visit (INDEPENDENT_AMBULATORY_CARE_PROVIDER_SITE_OTHER): Payer: Medicaid Other | Admitting: Family Medicine

## 2022-12-09 ENCOUNTER — Encounter: Payer: Self-pay | Admitting: Family Medicine

## 2022-12-09 VITALS — BP 110/80 | HR 89 | Ht 66.0 in | Wt 217.0 lb

## 2022-12-09 DIAGNOSIS — E669 Obesity, unspecified: Secondary | ICD-10-CM

## 2022-12-09 DIAGNOSIS — Z9189 Other specified personal risk factors, not elsewhere classified: Secondary | ICD-10-CM | POA: Diagnosis not present

## 2022-12-09 DIAGNOSIS — Z713 Dietary counseling and surveillance: Secondary | ICD-10-CM

## 2022-12-09 DIAGNOSIS — E6609 Other obesity due to excess calories: Secondary | ICD-10-CM | POA: Diagnosis not present

## 2022-12-09 IMAGING — MR MR KNEE*L* W/O CM
7 series · 40 of 40 positions shown · non-contrast
Comparison: MR knee 10/25/2014.

CLINICAL DATA: Left knee pain for over 7 years.

EXAM:
MRI OF THE LEFT KNEE WITHOUT CONTRAST
TECHNIQUE: Multiplanar, multisequence MR imaging of the knee was performed. No
intravenous contrast was administered.

[Series 8: T2 fat-sat · axial · left · 4.0mm · 0.47mm/px · z∈[-100,+24]mm · 5 of 26 slices shown (1 of 3)]
[im 1/26]
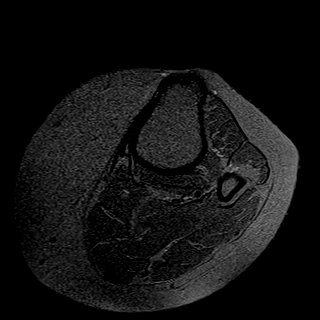
[im 7/26]
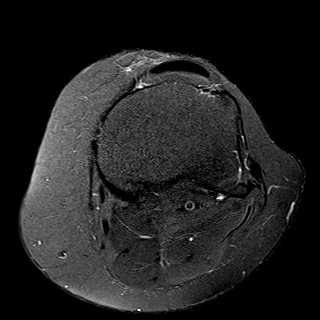
[im 13/26]
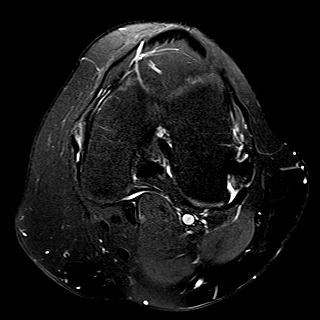
[im 19/26]
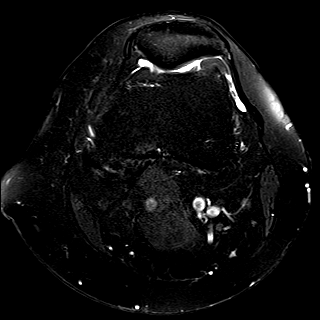
[im 26/26]
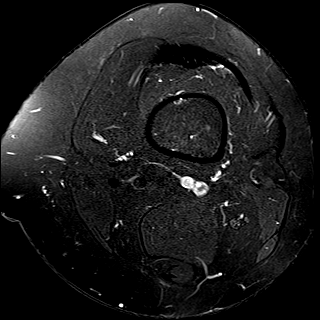

[Series 9: T1 · coronal · left · 4.0mm · 0.59mm/px · 5 of 24 slices shown]
[im 1/24]
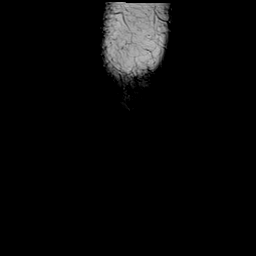
[im 6/24]
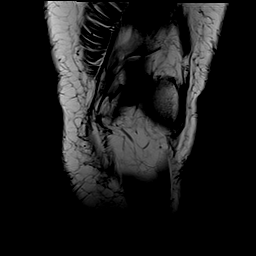
[im 12/24]
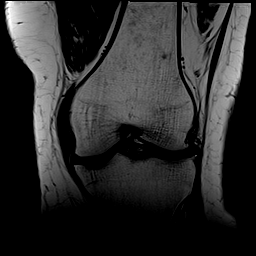
[im 18/24]
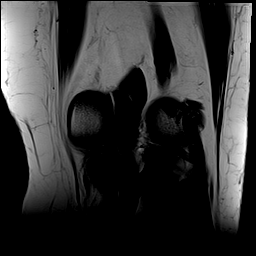
[im 24/24]
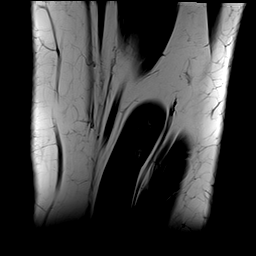

[Series 10: T2 fat-sat · coronal · left · 4.0mm · 0.59mm/px · 6 of 28 slices shown (2 of 3)]
[im 1/28]
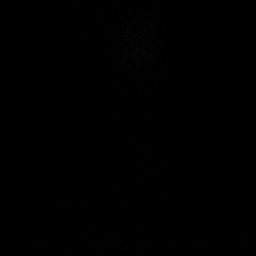
[im 6/28]
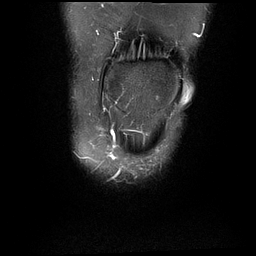
[im 11/28]
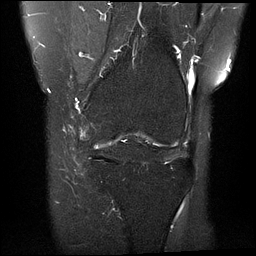
[im 17/28]
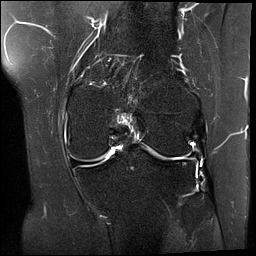
[im 22/28]
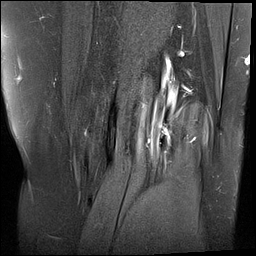
[im 28/28]
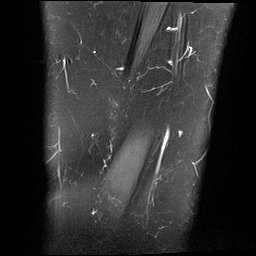

[Series 11: PD fat-sat · coronal · left · 4.0mm · 0.59mm/px · 6 of 28 slices shown (1 of 2)]
[im 1/28]
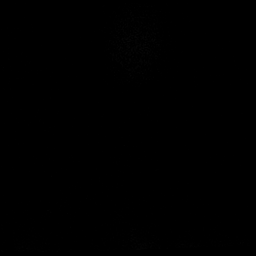
[im 6/28]
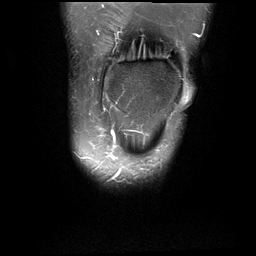
[im 11/28]
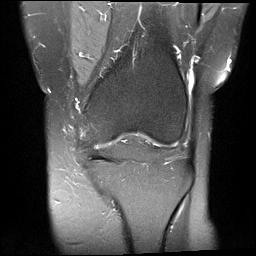
[im 17/28]
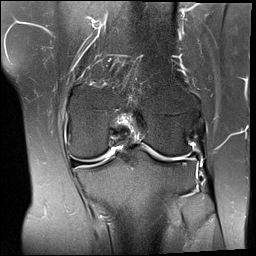
[im 22/28]
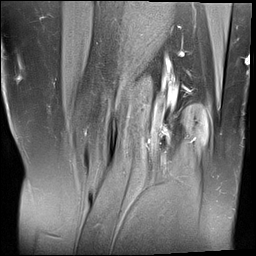
[im 28/28]
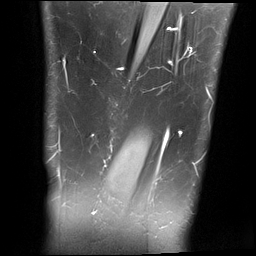

[Series 12: PD fat-sat · sagittal · left · 3.0mm · 0.52mm/px · 7 of 34 slices shown (2 of 2)]
[im 1/34]
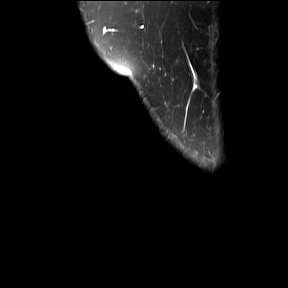
[im 6/34]
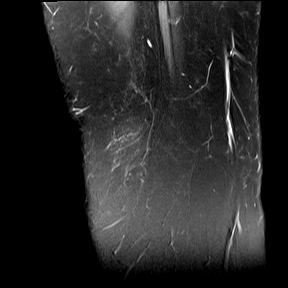
[im 12/34]
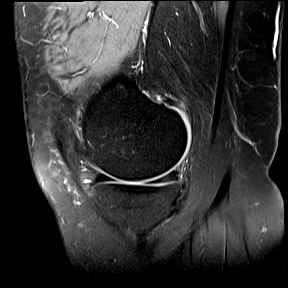
[im 17/34]
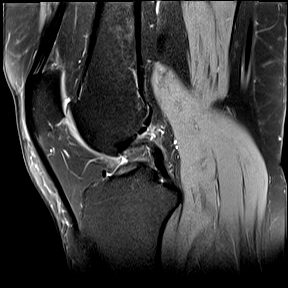
[im 23/34]
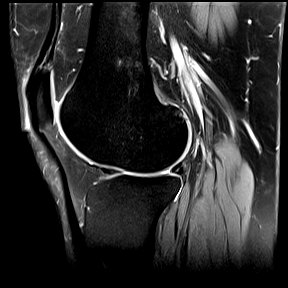
[im 28/34]
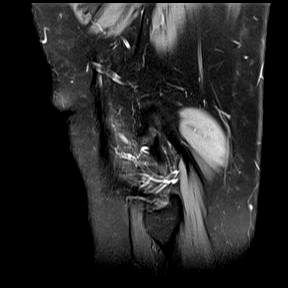
[im 34/34]
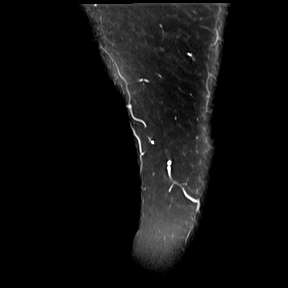

[Series 13: T2 fat-sat · sagittal · left · 3.0mm · 0.59mm/px · 7 of 34 slices shown (3 of 3)]
[im 1/34]
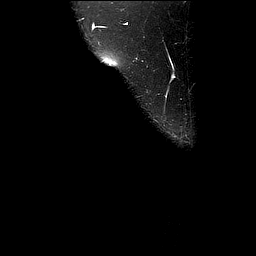
[im 6/34]
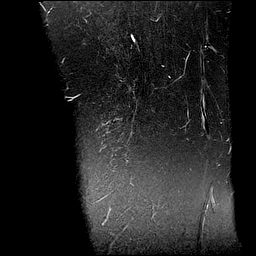
[im 12/34]
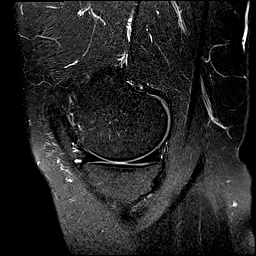
[im 17/34]
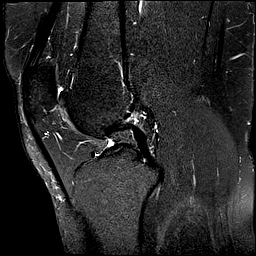
[im 23/34]
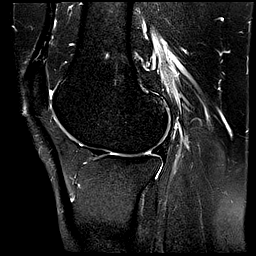
[im 28/34]
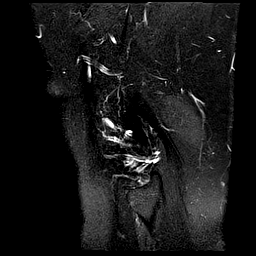
[im 34/34]
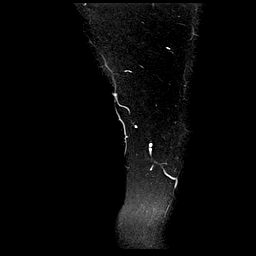

[Series 14: PD · coronal · left · 2.0mm · 0.47mm/px · 4 of 18 slices shown]
[im 1/18]
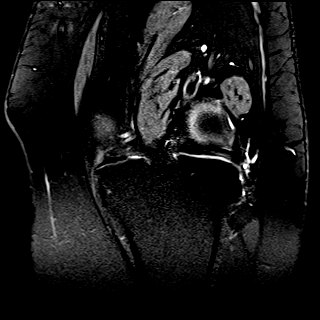
[im 6/18]
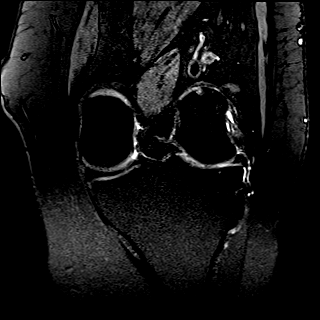
[im 12/18]
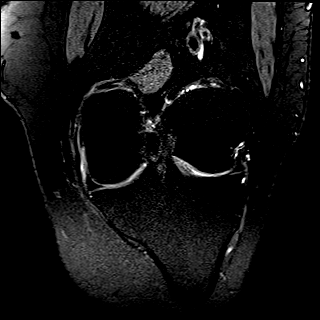
[im 18/18]
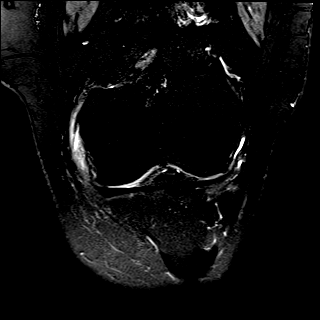

[40 of 40 positions shown; findings below may reference images not displayed]

FINDINGS: MENISCI

Medial meniscus:  Intact.

Lateral meniscus: New blunting of the body and posterior horn free
edge consistent with partial meniscectomy. Resolved expansion and
increased signal of the body. New tiny oblique undersurface tear at
the body/posterior horn junction (series 14, images 4-6; series 10,
image 12).

LIGAMENTS

Cruciates:  Intact ACL and PCL.

Collaterals: Medial collateral ligament is intact. Lateral
collateral ligament complex is intact.

CARTILAGE

Patellofemoral: Progressive partial-thickness cartilage loss over
the lateral patellar facet with tiny subchondral cystic change.

Medial:  No chondral defect.

Lateral: Progressive cartilage thinning over the central
weight-bearing lateral femoral condyle and lateral tibial plateau.

Joint:  No joint effusion. Normal Hoffa's fat. No plical thickening.

Popliteal Fossa:  No Baker cyst. Intact popliteus tendon.

Extensor Mechanism: Intact quadriceps tendon and patellar tendon.
Intact medial and lateral patellar retinaculum. Intact MPFL.

Bones: No focal marrow signal abnormality. No fracture or
dislocation.

Other: None.
IMPRESSION: 1. Interval lateral meniscus partial meniscectomy. New tiny oblique
undersurface tear at the body/posterior horn junction of the lateral
meniscus.
2. Progressive mild lateral and patellofemoral compartment
osteoarthritis.

## 2022-12-09 MED ORDER — CYCLOBENZAPRINE HCL 10 MG PO TABS: 10.0000 mg | ORAL_TABLET | Freq: Three times a day (TID) | ORAL | 1 refills | Status: DC | PRN
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
## 2022-12-09 NOTE — Patient Instructions (Signed)
It was pleasure meeting with you today. Please take medications as prescribed. Follow up with your primary health provider if any health concerns arises.

## 2022-12-09 NOTE — Assessment & Plan Note (Signed)
Patient on Paliperidone ER injection EKG ordered

## 2022-12-09 NOTE — Assessment & Plan Note (Addendum)
Started patient on weight management plan Discussed the importance to start eating 3 meals a day including breakfast, drink 8 glasses of water a day ,reduce portion sizes. reduced carbohydrates limit saturated and trans fat, increase servings of vegetables and limit processed foods. Find an activity that you will enjoy and start to be active at least 5 days a week for 30 minutes each day. Keep a food journal or an activity journal to identify triggers that lead to emotional eating Follow up in 4 weeks for Weight loss management History of binge eating disorder Referall sent to nutrition services and exercise program

## 2022-12-10 ENCOUNTER — Other Ambulatory Visit: Payer: Self-pay | Admitting: Family Medicine

## 2022-12-10 DIAGNOSIS — R9431 Abnormal electrocardiogram [ECG] [EKG]: Secondary | ICD-10-CM

## 2022-12-17 NOTE — Telephone Encounter (Signed)
erro  neous encounter

## 2022-12-23 ENCOUNTER — Ambulatory Visit: Payer: Medicaid Other | Admitting: Family Medicine

## 2022-12-25 ENCOUNTER — Ambulatory Visit (HOSPITAL_COMMUNITY): Payer: No Typology Code available for payment source | Admitting: Psychiatry

## 2022-12-29 ENCOUNTER — Ambulatory Visit (INDEPENDENT_AMBULATORY_CARE_PROVIDER_SITE_OTHER): Payer: No Typology Code available for payment source | Admitting: Psychiatry

## 2022-12-29 DIAGNOSIS — F250 Schizoaffective disorder, bipolar type: Secondary | ICD-10-CM | POA: Diagnosis not present

## 2022-12-29 DIAGNOSIS — Z79899 Other long term (current) drug therapy: Secondary | ICD-10-CM

## 2022-12-29 NOTE — Progress Notes (Signed)
Patient came into office to get her Paula Medina 156mg /mL injection. Injection was given to her in the Left Deltoid. Patient did not have any C/O for this injection she received today and from previous injection. Per pt she is no sleeping

## 2023-01-01 ENCOUNTER — Ambulatory Visit (HOSPITAL_COMMUNITY): Payer: No Typology Code available for payment source | Admitting: Psychiatry

## 2023-01-01 ENCOUNTER — Other Ambulatory Visit (HOSPITAL_COMMUNITY): Payer: Self-pay | Admitting: Psychiatry

## 2023-01-01 DIAGNOSIS — F4001 Agoraphobia with panic disorder: Secondary | ICD-10-CM

## 2023-01-01 DIAGNOSIS — G8929 Other chronic pain: Secondary | ICD-10-CM

## 2023-01-01 DIAGNOSIS — F5105 Insomnia due to other mental disorder: Secondary | ICD-10-CM

## 2023-01-01 DIAGNOSIS — F250 Schizoaffective disorder, bipolar type: Secondary | ICD-10-CM

## 2023-01-01 DIAGNOSIS — S069X9A Unspecified intracranial injury with loss of consciousness of unspecified duration, initial encounter: Secondary | ICD-10-CM

## 2023-01-03 ENCOUNTER — Other Ambulatory Visit: Payer: Self-pay | Admitting: Family Medicine

## 2023-01-03 DIAGNOSIS — G8929 Other chronic pain: Secondary | ICD-10-CM

## 2023-01-05 ENCOUNTER — Encounter (HOSPITAL_COMMUNITY): Payer: Self-pay | Admitting: Psychiatry

## 2023-01-05 ENCOUNTER — Telehealth (INDEPENDENT_AMBULATORY_CARE_PROVIDER_SITE_OTHER): Payer: No Typology Code available for payment source | Admitting: Psychiatry

## 2023-01-05 DIAGNOSIS — F4310 Post-traumatic stress disorder, unspecified: Secondary | ICD-10-CM | POA: Diagnosis not present

## 2023-01-05 DIAGNOSIS — F4001 Agoraphobia with panic disorder: Secondary | ICD-10-CM

## 2023-01-05 DIAGNOSIS — F250 Schizoaffective disorder, bipolar type: Secondary | ICD-10-CM

## 2023-01-05 DIAGNOSIS — Z79899 Other long term (current) drug therapy: Secondary | ICD-10-CM

## 2023-01-05 DIAGNOSIS — S069X9A Unspecified intracranial injury with loss of consciousness of unspecified duration, initial encounter: Secondary | ICD-10-CM

## 2023-01-05 DIAGNOSIS — F411 Generalized anxiety disorder: Secondary | ICD-10-CM | POA: Diagnosis not present

## 2023-01-05 NOTE — Patient Instructions (Signed)
We did not make any medication changes today.  If you are noticing worsening cravings for drugs let someone know that can help you stay accountable for not using.  Do try to go by the clinic to have your blood work drawn when you get a chance.
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
## 2023-01-06 ENCOUNTER — Other Ambulatory Visit (HOSPITAL_COMMUNITY): Payer: Self-pay | Admitting: Psychiatry

## 2023-01-06 ENCOUNTER — Ambulatory Visit: Payer: Medicaid Other | Admitting: Family Medicine

## 2023-01-06 DIAGNOSIS — F250 Schizoaffective disorder, bipolar type: Secondary | ICD-10-CM

## 2023-01-06 DIAGNOSIS — F411 Generalized anxiety disorder: Secondary | ICD-10-CM

## 2023-01-07 LAB — CMP14+EGFR
ALT: 15 IU/L (ref 0–32)
AST: 11 IU/L (ref 0–40)
Albumin/Globulin Ratio: 1.5 (ref 1.2–2.2)
Albumin: 4.1 g/dL (ref 3.9–4.9)
Alkaline Phosphatase: 52 IU/L (ref 44–121)
BUN/Creatinine Ratio: 11 (ref 9–23)
BUN: 10 mg/dL (ref 6–24)
Bilirubin Total: 0.4 mg/dL (ref 0.0–1.2)
CO2: 21 mmol/L (ref 20–29)
Calcium: 9.4 mg/dL (ref 8.7–10.2)
Chloride: 101 mmol/L (ref 96–106)
Creatinine, Ser: 0.95 mg/dL (ref 0.57–1.00)
Globulin, Total: 2.7 g/dL (ref 1.5–4.5)
Glucose: 116 mg/dL — ABNORMAL HIGH (ref 70–99)
Potassium: 3.6 mmol/L (ref 3.5–5.2)
Sodium: 141 mmol/L (ref 134–144)
Total Protein: 6.8 g/dL (ref 6.0–8.5)
eGFR: 74 mL/min/{1.73_m2} (ref >59)

## 2023-01-07 LAB — HEPATITIS C ANTIBODY: Hep C Virus Ab: NONREACTIVE

## 2023-01-07 LAB — CBC WITH DIFFERENTIAL/PLATELET
Basophils Absolute: 0.1 10*3/uL (ref 0.0–0.2)
Basos: 1 %
EOS (ABSOLUTE): 0.2 10*3/uL (ref 0.0–0.4)
Eos: 3 %
Hematocrit: 39.6 % (ref 34.0–46.6)
Hemoglobin: 13.5 g/dL (ref 11.1–15.9)
Immature Grans (Abs): 0 10*3/uL (ref 0.0–0.1)
Immature Granulocytes: 0 %
Lymphocytes Absolute: 3.3 10*3/uL — ABNORMAL HIGH (ref 0.7–3.1)
Lymphs: 51 %
MCH: 28.9 pg (ref 26.6–33.0)
MCHC: 34.1 g/dL (ref 31.5–35.7)
MCV: 85 fL (ref 79–97)
Monocytes Absolute: 0.6 10*3/uL (ref 0.1–0.9)
Monocytes: 8 %
Neutrophils Absolute: 2.5 10*3/uL (ref 1.4–7.0)
Neutrophils: 37 %
Platelets: 241 10*3/uL (ref 150–450)
RBC: 4.67 x10E6/uL (ref 3.77–5.28)
RDW: 14.8 % (ref 11.7–15.4)
WBC: 6.6 10*3/uL (ref 3.4–10.8)

## 2023-01-07 LAB — LIPID PANEL
Chol/HDL Ratio: 3.1 ratio (ref 0.0–4.4)
Cholesterol, Total: 126 mg/dL (ref 100–199)
HDL: 41 mg/dL (ref >39)
LDL Chol Calc (NIH): 57 mg/dL (ref 0–99)
Triglycerides: 169 mg/dL — ABNORMAL HIGH (ref 0–149)
VLDL Cholesterol Cal: 28 mg/dL (ref 5–40)

## 2023-01-07 LAB — HIV ANTIBODY (ROUTINE TESTING W REFLEX): HIV Screen 4th Generation wRfx: NONREACTIVE

## 2023-01-07 LAB — HEMOGLOBIN A1C
Est. average glucose Bld gHb Est-mCnc: 126 mg/dL
Hgb A1c MFr Bld: 6 % — ABNORMAL HIGH (ref 4.8–5.6)

## 2023-01-15 ENCOUNTER — Encounter: Payer: Self-pay | Admitting: Family Medicine

## 2023-01-15 ENCOUNTER — Ambulatory Visit (INDEPENDENT_AMBULATORY_CARE_PROVIDER_SITE_OTHER): Payer: Medicaid Other | Admitting: Family Medicine

## 2023-01-15 ENCOUNTER — Other Ambulatory Visit (HOSPITAL_COMMUNITY)
Admission: RE | Admit: 2023-01-15 | Discharge: 2023-01-15 | Disposition: A | Discharge status: Home / Self Care | Payer: Medicaid Other | Source: Ambulatory Visit | Attending: Family Medicine | Admitting: Family Medicine

## 2023-01-15 VITALS — BP 115/83 | HR 95 | Ht 66.0 in | Wt 218.0 lb

## 2023-01-15 DIAGNOSIS — Z124 Encounter for screening for malignant neoplasm of cervix: Secondary | ICD-10-CM | POA: Insufficient documentation

## 2023-01-15 DIAGNOSIS — K5900 Constipation, unspecified: Secondary | ICD-10-CM | POA: Diagnosis not present

## 2023-01-15 DIAGNOSIS — R1084 Generalized abdominal pain: Secondary | ICD-10-CM

## 2023-01-15 DIAGNOSIS — R109 Unspecified abdominal pain: Secondary | ICD-10-CM | POA: Insufficient documentation

## 2023-01-15 MED ORDER — POLYETHYLENE GLYCOL 3350 17 GM/SCOOP PO POWD: 17.0000 g | Freq: Once | ORAL | 1 refills | Status: AC | PRN

## 2023-01-15 NOTE — Progress Notes (Signed)
Patient Office Visit   Subjective   Patient ID: Paula Medina, female    DOB: 1976/07/17  Age: 47 y.o. MRN: [id number]  CC:  Chief Complaint  Patient presents with   Annual Exam    HPI Paula Medina 47 year old female, presents to the clinic for abdominal pain and pap smear. 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  Abdominal Pain This is a chronic problem started a few months ago. Patient reports abdominal pain and bloating occurs constantly and  has been gradually worsening. The pain is located in the generalized abdominal region and radiates to the left and right. The pain is at a severity of 10/10. The quality of the pain is aching, colicky, cramping, dull and sharp. Associated symptoms include belching and constipation. Pertinent negatives include no diarrhea, dysuria, fever, hematochezia, hematuria, nausea or vomiting. The pain is aggravated by being still. The pain is relieved by bowel movements. She has tried antacids for the symptoms. The treatment provided no relief.      Outpatient Encounter Medications as of 01/15/2023  Medication Sig   amLODipine (NORVASC) 10 MG tablet Take 1 tablet (10 mg total) by mouth daily.   cyclobenzaprine (FLEXERIL) 5 MG tablet TAKE 1 TABLET BY MOUTH THREE TIMES DAILY AS NEEDED FOR MUSCLE SPASMS   divalproex (DEPAKOTE ER) 500 MG 24 hr tablet TAKE 1 TABLET BY MOUTH TWICE DAILY   gabapentin (NEURONTIN) 400 MG capsule Take 1 capsule (400 mg total) by mouth 3 (three) times daily.   losartan-hydrochlorothiazide (HYZAAR) 100-25 MG tablet Take 1 tablet by mouth daily.   naproxen (NAPROSYN) 500 MG tablet Take 1 tablet (500 mg total) by mouth 2 (two) times daily.   Paliperidone ER (INVEGA SUSTENNA) injection Inject 117 mg into the muscle every 30 (thirty) days.   polyethylene glycol powder  (GLYCOLAX/MIRALAX) 17 GM/SCOOP powder Take 17 g by mouth once as needed for up to 1 dose.   rosuvastatin (CRESTOR) 10 MG tablet Take 10 mg by mouth daily.   traZODone (DESYREL) 100 MG tablet Take 1 tablet (100 mg total) by mouth at bedtime as needed. for sleep   Facility-Administered Encounter Medications as of 01/15/2023  Medication   Paliperidone ER (INVEGA SUSTENNA) injection 117 mg    Past Surgical History:  Procedure Laterality Date   CESAREAN SECTION     KNEE SURGERY      Review of Systems  Constitutional:  Negative for fever.  Respiratory:  Negative for cough.   Cardiovascular:  Negative for chest pain.  Gastrointestinal:  Positive for abdominal pain and constipation. Negative for blood in stool, diarrhea, heartburn, melena, nausea and vomiting.  Genitourinary:  Negative for dysuria, flank pain, frequency, hematuria and urgency.  Skin:  Negative for rash.  Neurological:  Negative for dizziness and headaches.      Objective    BP 115/83   Pulse 95   Ht 5\' 6"  (1.676 m)   Wt 218 lb (98.9 kg)   SpO2 96%   BMI 35.19 kg/m   Physical Exam Vitals reviewed.  Constitutional:      General: She is not in acute distress.    Appearance: Normal appearance. She is obese. She is not ill-appearing, toxic-appearing or diaphoretic.  Eyes:     General:        Right eye: No discharge.        Left eye: No discharge.  Conjunctiva/sclera: Conjunctivae normal.  Cardiovascular:     Rate and Rhythm: Normal rate.     Heart sounds: Normal heart sounds.  Pulmonary:     Effort: Pulmonary effort is normal. No respiratory distress.     Breath sounds: Normal breath sounds.  Abdominal:     General: Bowel sounds are normal. There is distension.     Palpations: Abdomen is soft. There is no shifting dullness, mass or pulsatile mass.     Tenderness: There is generalized abdominal tenderness. There is no right CVA tenderness, left CVA tenderness or guarding. Negative signs include Murphy's  sign, McBurney's sign and psoas sign.  Genitourinary:    General: Normal vulva.     Exam position: Lithotomy position.     Pubic Area: No rash or pubic lice.      Labia:        Right: No rash, tenderness, lesion or injury.        Left: No rash, tenderness, lesion or injury.      Urethra: No urethral pain or urethral swelling.  Musculoskeletal:        General: Normal range of motion.     Cervical back: Normal range of motion.  Skin:    General: Skin is warm and dry.  Neurological:     General: No focal deficit present.     Mental Status: She is alert and oriented to person, place, and time. Mental status is at baseline.  Psychiatric:        Mood and Affect: Mood normal.        Behavior: Behavior normal.        Thought Content: Thought content normal.        Judgment: Judgment normal.       Assessment & Plan:  Generalized abdominal pain Assessment & Plan: Abdominal ultrasound ordered- awaiting results will follow up. Patient reported she usually has 2 bowel movements per week that's her norm. Discussed Increase oral fluid and fiber intake. Bland diet as tolerated. Avoid fluids that have a lot sugar or caffeine, Avoid spicy or fatty food. Avoid alcohol. Can take OTC tylenol for pain. Follow-up in unable to keep food/fluid down x 24 hours, dizziness, fevers, worsening or persistent symptoms to present to ED or contact primary care provider. Patient verbalizes understanding regarding plan of care and all questions answered.   Orders: -     US Abdomen Complete; Future  Screening for cervical cancer -     Cytology - PAP  Constipation, unspecified constipation type -     Polyethylene Glycol 3350; Take 17 g by mouth once as needed for up to 1 dose.  Dispense: 3350 g; Refill: 1  Encounter for Papanicolaou smear of cervix Assessment & Plan: Pap smear done, Patient tolerated procedure well. Informed patient I will keep them updated on results. Left and right breasts appear normal, no  suspicious masses, no skin or nipple changes or axillary nodes.     Return in about 1 week (around 01/22/2023) for ankle swelling .   Cruzita Lederer Newman Nip, FNP

## 2023-01-15 NOTE — Patient Instructions (Signed)
It was pleasure meeting with you today. Please take medications as prescribed. Follow up with your primary health provider if any health concerns arises. If symptoms worsen please contact your primary care provider and/or visit the emergency department.  

## 2023-01-15 NOTE — Assessment & Plan Note (Addendum)
Abdominal ultrasound ordered- awaiting results will follow up. Patient reported she usually has 2 bowel movements per week that's her norm. Discussed Increase oral fluid and fiber intake. Bland diet as tolerated. Avoid fluids that have a lot sugar or caffeine, Avoid spicy or fatty food. Avoid alcohol. Can take OTC tylenol for pain. Follow-up in unable to keep food/fluid down x 24 hours, dizziness, fevers, worsening or persistent symptoms to present to ED or contact primary care provider. Patient verbalizes understanding regarding plan of care and all questions answered.

## 2023-01-15 NOTE — Assessment & Plan Note (Signed)
Pap smear done, Patient tolerated procedure well. Informed patient I will keep them updated on results. Left and right breasts appear normal, no suspicious masses, no skin or nipple changes or axillary nodes.   

## 2023-01-20 LAB — CYTOLOGY - PAP
Comment: NEGATIVE
Diagnosis: UNDETERMINED — AB
High risk HPV: NEGATIVE

## 2023-01-22 ENCOUNTER — Other Ambulatory Visit: Payer: Self-pay | Admitting: Family Medicine

## 2023-01-23 ENCOUNTER — Other Ambulatory Visit: Payer: Self-pay | Admitting: Family Medicine

## 2023-01-23 DIAGNOSIS — R87610 Atypical squamous cells of undetermined significance on cytologic smear of cervix (ASC-US): Secondary | ICD-10-CM

## 2023-01-23 NOTE — Progress Notes (Unsigned)
REF

## 2023-01-28 ENCOUNTER — Ambulatory Visit (HOSPITAL_COMMUNITY)
Admission: RE | Admit: 2023-01-28 | Discharge: 2023-01-28 | Disposition: A | Discharge status: Home / Self Care | Payer: Medicaid Other | Source: Ambulatory Visit | Attending: Family Medicine | Admitting: Family Medicine

## 2023-01-28 DIAGNOSIS — R1084 Generalized abdominal pain: Secondary | ICD-10-CM | POA: Insufficient documentation

## 2023-01-29 ENCOUNTER — Encounter (HOSPITAL_COMMUNITY): Payer: Self-pay | Admitting: Psychiatry

## 2023-01-29 ENCOUNTER — Ambulatory Visit (INDEPENDENT_AMBULATORY_CARE_PROVIDER_SITE_OTHER): Payer: No Typology Code available for payment source | Admitting: *Deleted

## 2023-01-29 VITALS — BP 127/86 | HR 84 | Ht 66.0 in | Wt 213.8 lb

## 2023-01-29 DIAGNOSIS — Z79899 Other long term (current) drug therapy: Secondary | ICD-10-CM

## 2023-01-29 DIAGNOSIS — F250 Schizoaffective disorder, bipolar type: Secondary | ICD-10-CM | POA: Diagnosis not present

## 2023-01-29 NOTE — Patient Instructions (Signed)
Follow-up in 4 weeks

## 2023-01-29 NOTE — Progress Notes (Signed)
Patient came into office to get her Gean Birchwood /mL injection. Injection was given to her in the Right Deltoid. Patient did not have any C/O for this injection she received today and from previous injection.

## 2023-01-30 ENCOUNTER — Other Ambulatory Visit (HOSPITAL_COMMUNITY): Payer: Self-pay | Admitting: Psychiatry

## 2023-01-30 ENCOUNTER — Ambulatory Visit (HOSPITAL_COMMUNITY)
Admission: RE | Admit: 2023-01-30 | Discharge: 2023-01-30 | Disposition: A | Discharge status: Home / Self Care | Payer: Medicaid Other | Source: Ambulatory Visit | Attending: Family Medicine | Admitting: Family Medicine

## 2023-01-30 ENCOUNTER — Other Ambulatory Visit: Payer: Self-pay | Admitting: Family Medicine

## 2023-01-30 ENCOUNTER — Encounter: Payer: Self-pay | Admitting: Family Medicine

## 2023-01-30 ENCOUNTER — Ambulatory Visit (INDEPENDENT_AMBULATORY_CARE_PROVIDER_SITE_OTHER): Payer: Medicaid Other | Admitting: Family Medicine

## 2023-01-30 VITALS — BP 110/81 | HR 97 | Resp 16 | Ht 66.0 in | Wt 212.0 lb

## 2023-01-30 DIAGNOSIS — M545 Low back pain, unspecified: Secondary | ICD-10-CM

## 2023-01-30 DIAGNOSIS — M25471 Effusion, right ankle: Secondary | ICD-10-CM | POA: Insufficient documentation

## 2023-01-30 DIAGNOSIS — M25472 Effusion, left ankle: Secondary | ICD-10-CM | POA: Diagnosis present

## 2023-01-30 DIAGNOSIS — M25475 Effusion, left foot: Secondary | ICD-10-CM | POA: Diagnosis present

## 2023-01-30 DIAGNOSIS — M5441 Lumbago with sciatica, right side: Secondary | ICD-10-CM | POA: Diagnosis not present

## 2023-01-30 DIAGNOSIS — G8929 Other chronic pain: Secondary | ICD-10-CM

## 2023-01-30 DIAGNOSIS — M25474 Effusion, right foot: Secondary | ICD-10-CM

## 2023-01-30 DIAGNOSIS — F411 Generalized anxiety disorder: Secondary | ICD-10-CM

## 2023-01-30 DIAGNOSIS — M5442 Lumbago with sciatica, left side: Secondary | ICD-10-CM

## 2023-01-30 DIAGNOSIS — F250 Schizoaffective disorder, bipolar type: Secondary | ICD-10-CM

## 2023-01-30 MED ORDER — CYCLOBENZAPRINE HCL 10 MG PO TABS: 10.0000 mg | ORAL_TABLET | Freq: Three times a day (TID) | ORAL | 0 refills | Status: DC | PRN

## 2023-01-30 NOTE — Patient Instructions (Signed)

## 2023-01-30 NOTE — Progress Notes (Signed)
Patient Office Visit   Subjective   Patient ID: Paula Medina, female    DOB: 12-07-1975  Age: 47 y.o. MRN: [id number]  CC:  Chief Complaint  Patient presents with   Depression    Depression seems to be getting worse because of her back pain    Leg Swelling    States both ankles have been swelling for a long while and don't go down at night when elevated     HPI Paula Medina 47 year old female, presents to the clinic for back pain and bilateral ankle swelling. 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  Back Pain This is a chronic problem. Patient reports back pain occurs constantly and has been rapidly worsening since onset. The pain is present in the gluteal and lumbar spine. The quality of the pain is described as aching, shooting and stabbing. The pain is at a severity of 10/10. The pain is The same all the time. The symptoms are aggravated by lying down, position, sitting, standing and bending. Stiffness is present all day. Associated symptoms include leg pain, numbness, tingling and weakness. Pertinent negatives include no bladder incontinence, bowel incontinence, dysuria, pelvic pain or perianal numbness. Risk factors include lack of exercise, obesity and poor posture. She has tried muscle relaxant for the symptoms. The treatment provided mild relief.   Ankle Pain  There was no injury mechanism. Symptoms started a few weeks ago. The pain is present in the right ankle and left ankle and right and left calf. The quality of the pain is described as aching and cramping.The pain is at a severity of 10/10. The pain has been constant since onset. Associated symptoms include numbness and tingling.The symptoms are aggravated by movement and weight bearing. She has tried elevation and rest for the symptoms. The treatment provided  no relief.      Outpatient Encounter Medications as of 01/30/2023  Medication Sig   amLODipine (NORVASC) 10 MG tablet Take 1 tablet (10 mg total) by mouth daily.   cyclobenzaprine (FLEXERIL) 10 MG tablet Take 1 tablet (10 mg total) by mouth 3 (three) times daily as needed for muscle spasms.   divalproex (DEPAKOTE ER) 500 MG 24 hr tablet TAKE 1 TABLET BY MOUTH TWICE DAILY   gabapentin (NEURONTIN) 400 MG capsule Take 1 capsule (400 mg total) by mouth 3 (three) times daily.   losartan-hydrochlorothiazide (HYZAAR) 100-25 MG tablet Take 1 tablet by mouth daily.   naproxen (NAPROSYN) 500 MG tablet Take 1 tablet (500 mg total) by mouth 2 (two) times daily.   polyethylene glycol powder (GLYCOLAX/MIRALAX) 17 GM/SCOOP powder Take 17 g by mouth once as needed for up to 1 dose.   rosuvastatin (CRESTOR) 10 MG tablet Take 10 mg by mouth daily.   traZODone (DESYREL) 100 MG tablet Take 1 tablet (100 mg total) by mouth at bedtime as needed. for sleep   [DISCONTINUED] cyclobenzaprine (FLEXERIL) 5 MG tablet TAKE 1 TABLET BY MOUTH THREE TIMES DAILY AS NEEDED FOR MUSCLE SPASMS   Paliperidone ER (INVEGA SUSTENNA) injection Inject 117 mg into the muscle every 30 (thirty) days. (Patient not taking: Reported on 01/30/2023)   Facility-Administered Encounter Medications as of 01/30/2023  Medication   Paliperidone ER (INVEGA SUSTENNA) injection 117 mg    Past Surgical History:  Procedure Laterality Date   CESAREAN SECTION     KNEE SURGERY  Review of Systems  Constitutional:  Negative for chills and fever.  HENT:  Negative for ear pain and tinnitus.   Respiratory:  Negative for shortness of breath.   Cardiovascular:  Negative for chest pain.  Gastrointestinal:  Negative for abdominal pain and vomiting.  Genitourinary:  Negative for dysuria.  Musculoskeletal:  Negative for myalgias.  Skin:  Negative for rash.  Neurological:  Negative for dizziness and headaches.      Objective    BP 110/81   Pulse 97    Resp 16   Ht 5\' 6"  (1.676 m)   Wt 212 lb (96.2 kg)   SpO2 96%   BMI 34.22 kg/m   Physical Exam Vitals reviewed.  Constitutional:      General: She is not in acute distress.    Appearance: Normal appearance. She is not ill-appearing, toxic-appearing or diaphoretic.  HENT:     Head: Normocephalic.  Eyes:     General:        Right eye: No discharge.        Left eye: No discharge.     Conjunctiva/sclera: Conjunctivae normal.  Cardiovascular:     Rate and Rhythm: Normal rate.     Pulses: Normal pulses.     Heart sounds: Normal heart sounds.  Pulmonary:     Effort: Pulmonary effort is normal. No respiratory distress.     Breath sounds: Normal breath sounds.  Abdominal:     General: Bowel sounds are normal.     Palpations: Abdomen is soft.  Musculoskeletal:        General: Swelling and tenderness present.     Cervical back: Normal range of motion.     Lumbar back: Positive right straight leg raise test and positive left straight leg raise test.     Right lower leg: Edema present.     Left lower leg: Edema present.     Right ankle: Tenderness present. Decreased range of motion. Normal pulse.     Left ankle: Tenderness present. Decreased range of motion. Normal pulse.  Skin:    General: Skin is warm and dry.     Capillary Refill: Capillary refill takes less than 2 seconds.  Neurological:     General: No focal deficit present.     Mental Status: She is alert and oriented to person, place, and time.     Coordination: Coordination normal.     Gait: Gait normal.  Psychiatric:        Mood and Affect: Mood normal.        Behavior: Behavior normal.       Assessment & Plan:  Bilateral swelling of feet and ankles Assessment & Plan: Bilateral calf pain noted on dosiflexion of foot. Ordered US Venous Doppler STAT- Awaiting results will treat accordingly.  Advise patient to visit ED if notice any new alarming symptoms such as chest pain, shortness of breath, headaches, or  dizziness. Discussed do not stand or sit in on spot for a long time, Avoid tight clothing that restricts blood flow in your legs. Increase fluid intake with water to avoid dehydration .   Orders: -     VAS [place] LOWER EXTREMITY VENOUS (DVT); Future  Lumbar pain -     DG Lumbar Spine 2-3 Views; Future  Chronic bilateral low back pain with bilateral sciatica Assessment & Plan: Continued back pain getting worse. Lumbar xray ordered- Awaiting results will follow up  Increased Flexeril 10 mg PRN   Other orders -  Cyclobenzaprine HCl; Take 1 tablet (10 mg total) by mouth 3 (three) times daily as needed for muscle spasms.  Dispense: 30 tablet; Refill: 0    No follow-ups on file.   Cruzita Lederer Newman Nip, FNP

## 2023-01-30 NOTE — Assessment & Plan Note (Signed)
Bilateral calf pain noted on dosiflexion of foot. Ordered US Venous Doppler STAT- Awaiting results will treat accordingly.  Advise patient to visit ED if notice any new alarming symptoms such as chest pain, shortness of breath, headaches, or dizziness. Discussed do not stand or sit in on spot for a long time, Avoid tight clothing that restricts blood flow in your legs. Increase fluid intake with water to avoid dehydration .

## 2023-01-30 NOTE — Addendum Note (Signed)
Addended by: Abner Greenspan on: 01/30/2023 01:43 PM   Modules accepted: Orders

## 2023-01-30 NOTE — Assessment & Plan Note (Addendum)
Continued back pain getting worse. Lumbar xray ordered- Awaiting results will follow up  Increased Flexeril 10 mg PRN

## 2023-02-02 ENCOUNTER — Telehealth: Payer: Self-pay | Admitting: Family Medicine

## 2023-02-02 NOTE — Telephone Encounter (Signed)
Patient advised.

## 2023-02-02 NOTE — Telephone Encounter (Signed)
Patient calling about xray results and ultrasound results

## 2023-02-03 ENCOUNTER — Ambulatory Visit: Payer: Medicaid Other | Admitting: Internal Medicine

## 2023-02-04 ENCOUNTER — Ambulatory Visit: Payer: Medicaid Other | Attending: Internal Medicine | Admitting: Internal Medicine

## 2023-02-04 ENCOUNTER — Encounter: Payer: Self-pay | Admitting: Internal Medicine

## 2023-02-04 VITALS — BP 139/98 | HR 116 | Ht 66.0 in | Wt 217.4 lb

## 2023-02-04 DIAGNOSIS — R000 Tachycardia, unspecified: Secondary | ICD-10-CM | POA: Diagnosis present

## 2023-02-04 DIAGNOSIS — R9431 Abnormal electrocardiogram [ECG] [EKG]: Secondary | ICD-10-CM | POA: Insufficient documentation

## 2023-02-04 NOTE — Progress Notes (Signed)
Cardiology Office Note  Date: 02/04/2023   ID: Paula Medina, DOB 1975/12/17, MRN [id number]  PCP:  Rica Records, FNP  Cardiologist:  Marjo Bicker, MD Electrophysiologist:  None   Reason for Office Visit: Evaluation of abnormal EKG   History of Present Illness: Paula Medina is a 47 y.o. female known to have HTN was referred to cardiology clinic for evaluation of abnormal EKG.  I reviewed the EKG from 12/09/2022 that showed sinus tachycardia and RBBB. No evidence of VT seen. She denies any symptoms of angina, DOE, palpitations, syncope and leg swelling. She does have ankle swelling and she takes amlodipine. No prior history of MI/PCI/CABG. No history of heart attacks. She quit a long time ago.  Patient has 3 sons but 2 sons passed away in 08-Apr-2020(on 06/29/24and June 4) after which she started to put on weight. She says she was not this heavy even when she was pregnant.  She has a 48 year old left right now.  Past Medical History:  Diagnosis Date   Caffeine overuse 10/02/2022   Chronic pain on long term opiate therapy 08/27/2022   Generalized anxiety disorder    Hypertension    Schizo affective schizophrenia (HCC)     Past Surgical History:  Procedure Laterality Date   CESAREAN SECTION     KNEE SURGERY      Current Outpatient Medications  Medication Sig Dispense Refill   amLODipine (NORVASC) 10 MG tablet Take 1 tablet (10 mg total) by mouth daily. 90 tablet 2   cyclobenzaprine (FLEXERIL) 10 MG tablet Take 1 tablet (10 mg total) by mouth 3 (three) times daily as needed for muscle spasms. 30 tablet 0   divalproex (DEPAKOTE ER) 500 MG 24 hr tablet TAKE 1 TABLET BY MOUTH TWICE DAILY 60 tablet 1   gabapentin (NEURONTIN) 400 MG capsule Take 1 capsule (400 mg total) by mouth 3 (three) times daily. 30 capsule 2   losartan-hydrochlorothiazide (HYZAAR) 100-25 MG tablet Take 1 tablet by mouth daily.     naproxen (NAPROSYN) 500 MG tablet Take 1 tablet (500 mg  total) by mouth 2 (two) times daily. 30 tablet 0   Paliperidone ER (INVEGA SUSTENNA) injection Inject 117 mg into the muscle every 30 (thirty) days. 0.9 mL 3   polyethylene glycol powder (GLYCOLAX/MIRALAX) 17 GM/SCOOP powder Take 17 g by mouth once as needed for up to 1 dose. 3350 g 1   rosuvastatin (CRESTOR) 10 MG tablet Take 10 mg by mouth daily.     traZODone (DESYREL) 100 MG tablet Take 1 tablet (100 mg total) by mouth at bedtime as needed. for sleep 30 tablet 1   Current Facility-Administered Medications  Medication Dose Route Frequency Provider Last Rate Last Admin   Paliperidone ER (INVEGA SUSTENNA) injection 117 mg  117 mg Intramuscular Q30 days Elsie Lincoln, MD   117 mg at 01/29/23 0957   Allergies:  Patient has no known allergies.   Social History: The patient  reports that she quit smoking about 29 years ago. Her smoking use included cigarettes. She has never used smokeless tobacco. She reports that she does not currently use drugs after having used the following drugs: Cocaine, Marijuana, and Heroin. She reports that she does not drink alcohol.   Family History: The patient's family history is not on file.   ROS:  Please see the history of present illness. Otherwise, complete review of systems is positive for none.  All other systems are reviewed and negative.  Physical Exam: VS:  BP (!) 139/98 (BP Location: Left Arm, Patient Position: Sitting, Cuff Size: Normal)   Pulse (!) 116   Ht 5\' 6"  (1.676 m)   Wt 217 lb 6.4 oz (98.6 kg)   SpO2 96%   BMI 35.09 kg/m , BMI Body mass index is 35.09 kg/m.  Wt Readings from Last 3 Encounters:  02/04/23 217 lb 6.4 oz (98.6 kg)  01/30/23 212 lb (96.2 kg)  01/15/23 218 lb (98.9 kg)    General: Patient appears comfortable at rest. HEENT: Conjunctiva and lids normal, oropharynx clear with moist mucosa. Neck: Supple, no elevated JVP or carotid bruits, no thyromegaly. Lungs: Clear to auscultation, nonlabored breathing at  rest. Cardiac: Regular rate and rhythm, no S3 or significant systolic murmur, no pericardial rub. Abdomen: Soft, nontender, no hepatomegaly, bowel sounds present, no guarding or rebound. Extremities: No pitting edema, distal pulses 2+. Skin: Warm and dry. Musculoskeletal: No kyphosis. Neuropsychiatric: Alert and oriented x3, affect grossly appropriate.  Recent Labwork: 01/05/2023: ALT 15; AST 11; BUN 10; Creatinine, Ser 0.95; Hemoglobin 13.5; Platelets 241; Potassium 3.6; Sodium 141     Component Value Date/Time   CHOL 126 01/05/2023 0826   TRIG 169 (H) 01/05/2023 0826   HDL 41 01/05/2023 0826   CHOLHDL 3.1 01/05/2023 0826   CHOLHDL 4.6 09/01/2022 0925   LDLCALC 57 01/05/2023 0826   LDLCALC 128 (H) 09/01/2022 0925    Other Studies Reviewed Today:   Assessment and Plan: Patient is a 47 year old F known to have HTN, was referred to cardiology clinic for evaluation of abnormal EKG.  # Abnormal EKG # Sinus tachycardia -I reviewed the EKG from 12/2022 that showed sinus tachycardia and RBBB. There is no evidence of ventricular tachycardia. Patient denies any palpitations. Sinus tachycardia could likely be secondary to underlying stress. In the future if she develops palpitations, can start metoprolol for symptomatic relief.  # HTN, controlled: Continue amlodipine 10 mg once daily, losartan-HCTZ 100-25 mg once daily.  She does have some ankle swelling but it is not bothering her.  I have spent a total of 30 minutes with patient reviewing chart, EKGs, labs and examining patient as well as establishing an assessment and plan that was discussed with the patient.  > 50% of time was spent in direct patient care.    Medication Adjustments/Labs and Tests Ordered: Current medicines are reviewed at length with the patient today.  Concerns regarding medicines are outlined above.   Tests Ordered: No orders of the defined types were placed in this encounter.   Medication Changes: No orders of  the defined types were placed in this encounter.   Disposition:  Follow up prn  Signed, Kiona Blume Verne Spurr, MD, 02/04/2023 4:12 PM    [facility] at [facility] [address], [place], [place] [address]

## 2023-02-04 NOTE — Patient Instructions (Signed)
Medication Instructions:  Your physician recommends that you continue on your current medications as directed. Please refer to the Current Medication list given to you today.  *If you need a refill on your cardiac medications before your next appointment, please call your pharmacy*   Lab Work: None If you have labs (blood work) drawn today and your tests are completely normal, you will receive your results only by: MyChart Message (if you have MyChart) OR A paper copy in the mail If you have any lab test that is abnormal or we need to change your treatment, we will call you to review the results.   Testing/Procedures: None   Follow-Up: At [facility], you and your health needs are our priority.  As part of our continuing mission to provide you with exceptional heart care, we have created designated Provider Care Teams.  These Care Teams include your primary Cardiologist (physician) and Advanced Practice Providers (APPs -  Physician Assistants and Nurse Practitioners) who all work together to provide you with the care you need, when you need it.  We recommend signing up for the patient portal called "MyChart".  Sign up information is provided on this After Visit Summary.  MyChart is used to connect with patients for Virtual Visits (Telemedicine).  Patients are able to view lab/test results, encounter notes, upcoming appointments, etc.  Non-urgent messages can be sent to your provider as well.   To learn more about what you can do with MyChart, go to [email or web address].    Your next appointment:   Follow up as needed.    Provider:   Vishnu Mallipeddi, MD    Other Instructions    

## 2023-02-05 ENCOUNTER — Telehealth: Payer: Self-pay | Admitting: Family Medicine

## 2023-02-05 NOTE — Telephone Encounter (Addendum)
Correct Pcs  Noted  Copied Sleeved Original in pcp box Copy front desk

## 2023-02-16 ENCOUNTER — Telehealth: Payer: Self-pay | Admitting: Family Medicine

## 2023-02-16 NOTE — Telephone Encounter (Signed)
Sarah w.[facility] called in on patient behalf. Needs med clarification    Call back info : Maralyn Sago [phone]  [facility]

## 2023-02-18 ENCOUNTER — Other Ambulatory Visit: Payer: Self-pay | Admitting: Family Medicine

## 2023-02-18 DIAGNOSIS — G8929 Other chronic pain: Secondary | ICD-10-CM

## 2023-02-19 ENCOUNTER — Other Ambulatory Visit: Payer: Self-pay | Admitting: Family Medicine

## 2023-02-19 ENCOUNTER — Other Ambulatory Visit: Payer: Self-pay

## 2023-02-19 ENCOUNTER — Telehealth: Payer: Self-pay | Admitting: Family Medicine

## 2023-02-19 DIAGNOSIS — G8929 Other chronic pain: Secondary | ICD-10-CM

## 2023-02-19 MED ORDER — GABAPENTIN 400 MG PO CAPS
400.0000 mg | ORAL_CAPSULE | Freq: Three times a day (TID) | ORAL | 0 refills | Status: DC
Start: 2023-02-19 — End: 2023-06-10

## 2023-02-19 NOTE — Telephone Encounter (Signed)
Spoke with pharmacy

## 2023-02-19 NOTE — Telephone Encounter (Signed)
Paula Medina from [facility] called again for clarification on gabapentin (NEURONTIN) 400 MG capsule [[id number]]

## 2023-02-24 ENCOUNTER — Ambulatory Visit (INDEPENDENT_AMBULATORY_CARE_PROVIDER_SITE_OTHER): Payer: Medicaid Other | Admitting: Family Medicine

## 2023-02-24 ENCOUNTER — Ambulatory Visit: Payer: Medicaid Other | Admitting: Family Medicine

## 2023-02-24 ENCOUNTER — Encounter: Payer: Self-pay | Admitting: Family Medicine

## 2023-02-24 VITALS — BP 129/79 | HR 107 | Ht 66.0 in | Wt 214.0 lb

## 2023-02-24 DIAGNOSIS — E538 Deficiency of other specified B group vitamins: Secondary | ICD-10-CM | POA: Diagnosis not present

## 2023-02-24 DIAGNOSIS — I10 Essential (primary) hypertension: Secondary | ICD-10-CM

## 2023-02-24 DIAGNOSIS — Z1329 Encounter for screening for other suspected endocrine disorder: Secondary | ICD-10-CM

## 2023-02-24 DIAGNOSIS — M545 Low back pain, unspecified: Secondary | ICD-10-CM | POA: Diagnosis not present

## 2023-02-24 DIAGNOSIS — R5383 Other fatigue: Secondary | ICD-10-CM

## 2023-02-24 DIAGNOSIS — G8929 Other chronic pain: Secondary | ICD-10-CM

## 2023-02-24 DIAGNOSIS — E559 Vitamin D deficiency, unspecified: Secondary | ICD-10-CM | POA: Diagnosis not present

## 2023-02-24 NOTE — Patient Instructions (Signed)

## 2023-02-24 NOTE — Progress Notes (Signed)
Patient Office Visit   Subjective   Patient ID: Paula Medina, female    DOB: [birth year]  Age: 47 y.o. MRN: [id number]  CC:  Chief Complaint  Patient presents with   Hypertension    Patient is here for HTN f/u. No changes or concerns since last visit.    Hyperlipidemia    HPI Paula Medina 47 year old female, presents to the clinic for HTN follow up. 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  Hypertension This is a recurrent problem. The problem has been gradually improving since onset. The problem is controlled. Pertinent negatives include no blurred vision, chest pain or headaches. Risk factors for coronary artery disease include dyslipidemia, obesity, smoking/tobacco exposure and sedentary lifestyle. Past treatments include angiotensin blockers and calcium channel blockers. The current treatment provides moderate improvement. Compliance problems include diet.       Outpatient Encounter Medications as of 02/24/2023  Medication Sig   amLODipine (NORVASC) 10 MG tablet Take 1 tablet (10 mg total) by mouth daily.   cyclobenzaprine (FLEXERIL) 10 MG tablet Take 1 tablet (10 mg total) by mouth 3 (three) times daily as needed for muscle spasms.   divalproex (DEPAKOTE ER) 500 MG 24 hr tablet TAKE 1 TABLET BY MOUTH TWICE DAILY   gabapentin (NEURONTIN) 400 MG capsule Take 1 capsule (400 mg total) by mouth 3 (three) times daily.   losartan-hydrochlorothiazide (HYZAAR) 100-25 MG tablet Take 1 tablet by mouth daily.   naproxen (NAPROSYN) 500 MG tablet Take 1 tablet (500 mg total) by mouth 2 (two) times daily.   Paliperidone ER (INVEGA SUSTENNA) injection Inject 117 mg into the muscle every 30 (thirty) days.   polyethylene glycol powder (GLYCOLAX/MIRALAX) 17 GM/SCOOP powder Take 17 g by mouth once as needed for up to 1 dose.    QUEtiapine (SEROQUEL XR) 400 MG 24 hr tablet TAKE 1 TABLET BY MOUTH AT BEDTIME   rosuvastatin (CRESTOR) 10 MG tablet Take 10 mg by mouth daily.   tiZANidine (ZANAFLEX) 4 MG tablet Take by mouth at bedtime.   traZODone (DESYREL) 100 MG tablet Take 1 tablet (100 mg total) by mouth at bedtime as needed. for sleep   Facility-Administered Encounter Medications as of 02/24/2023  Medication   Paliperidone ER (INVEGA SUSTENNA) injection 117 mg    Past Surgical History:  Procedure Laterality Date   CESAREAN SECTION     KNEE SURGERY      Review of Systems  Constitutional:  Negative for chills and fever.  Eyes:  Negative for blurred vision.  Respiratory:  Negative for cough.   Cardiovascular:  Negative for chest pain.  Gastrointestinal:  Negative for abdominal pain, nausea and vomiting.  Genitourinary:  Negative for dysuria.  Musculoskeletal:  Negative for myalgias.  Neurological:  Negative for dizziness and headaches.      Objective    BP 129/79   Pulse (!) 107   Ht 5\' 6"  (1.676 m)   Wt 214 lb (97.1 kg)   SpO2 95%   BMI 34.54 kg/m   Physical Exam Vitals reviewed.  Constitutional:      General: She is not in acute distress.    Appearance: Normal appearance. She is not ill-appearing, toxic-appearing or diaphoretic.  HENT:     Head: Normocephalic.  Eyes:     General:        Right eye: No discharge.  Left eye: No discharge.     Conjunctiva/sclera: Conjunctivae normal.  Cardiovascular:     Rate and Rhythm: Normal rate.     Pulses: Normal pulses.     Heart sounds: Normal heart sounds.  Pulmonary:     Effort: Pulmonary effort is normal. No respiratory distress.     Breath sounds: Normal breath sounds.  Musculoskeletal:        General: Normal range of motion.     Cervical back: Normal range of motion.  Skin:    General: Skin is warm and dry.     Capillary Refill: Capillary refill takes less than 2 seconds.  Neurological:     General: No focal deficit present.      Mental Status: She is alert and oriented to person, place, and time.     Coordination: Coordination normal.     Gait: Gait normal.  Psychiatric:        Mood and Affect: Mood normal.        Behavior: Behavior normal.       Assessment & Plan:  Chronic bilateral low back pain, unspecified whether sciatica present -     Ambulatory referral to Orthopedics -     Ambulatory referral to Physical Therapy  Screening for thyroid disorder -     TSH + free T4  Vitamin D deficiency -     VITAMIN D 25 Hydroxy (Vit-D Deficiency, Fractures)  Vitamin B 12 deficiency -     Vitamin B12  Primary hypertension Assessment & Plan: Vitals:   02/24/23 0829 02/24/23 0850  BP: 125/88 129/79  Blood pressure controlled in today's visit Contine amlodipine 10 mg and losartan-HCTZ 100-25 mg daily Continued discussion on DASH diet, low sodium diet and maintain a exercise routine for 150 minutes per week.    Other fatigue Assessment & Plan: Thyroid panel, Vit D and Vit B 12 ordered- awaiting results will follow up. Continued discussion on lifestyle changes, establishing a daily routine, sleep hygiene, going outdoors, exercise, healthy eating habits, mindfulness and mediatation.      Return in about 4 months (around 06/27/2023) for chronic follow-up, routine labs.   Cruzita Lederer Newman Nip, FNP

## 2023-02-24 NOTE — Telephone Encounter (Signed)
Error

## 2023-02-24 NOTE — Assessment & Plan Note (Signed)
Vitals:   02/24/23 0829 02/24/23 0850  BP: 125/88 129/79  Blood pressure controlled in today's visit Contine amlodipine 10 mg and losartan-HCTZ 100-25 mg daily Continued discussion on DASH diet, low sodium diet and maintain a exercise routine for 150 minutes per week.

## 2023-02-24 NOTE — Assessment & Plan Note (Signed)
Thyroid panel, Vit D and Vit B 12 ordered- awaiting results will follow up. Continued discussion on lifestyle changes, establishing a daily routine, sleep hygiene, going outdoors, exercise, healthy eating habits, mindfulness and mediatation.

## 2023-02-25 LAB — TSH+FREE T4
Free T4: 1.17 ng/dL (ref 0.82–1.77)
TSH: 2.41 u[IU]/mL (ref 0.450–4.500)

## 2023-02-25 LAB — VITAMIN D 25 HYDROXY (VIT D DEFICIENCY, FRACTURES): Vit D, 25-Hydroxy: 11 ng/mL — ABNORMAL LOW (ref 30.0–100.0)

## 2023-02-26 ENCOUNTER — Ambulatory Visit (INDEPENDENT_AMBULATORY_CARE_PROVIDER_SITE_OTHER): Payer: No Typology Code available for payment source

## 2023-02-26 ENCOUNTER — Encounter (HOSPITAL_COMMUNITY): Payer: Self-pay | Admitting: Psychiatry

## 2023-02-26 VITALS — BP 115/87 | HR 105 | Ht 66.0 in | Wt 211.6 lb

## 2023-02-26 DIAGNOSIS — F250 Schizoaffective disorder, bipolar type: Secondary | ICD-10-CM | POA: Diagnosis not present

## 2023-02-26 DIAGNOSIS — Z79899 Other long term (current) drug therapy: Secondary | ICD-10-CM | POA: Diagnosis not present

## 2023-02-26 NOTE — Patient Instructions (Signed)
Return in 4 weeks

## 2023-02-26 NOTE — Progress Notes (Signed)
Patient came into office to get her Gean Birchwood 156mg /mL injection. Injection was given to her in the Left Deltoid. Patient did not have any C/O for this injection she received today and from previous injection.

## 2023-03-03 ENCOUNTER — Telehealth: Payer: Self-pay | Admitting: Internal Medicine

## 2023-03-03 NOTE — Telephone Encounter (Signed)
Patient meant to call pcp, I gave her the number.

## 2023-03-03 NOTE — Telephone Encounter (Signed)
Patient is calling in regards to paperwork she states was suppose to be filled out. She would like update on when these will be ready for pick-up.

## 2023-03-05 ENCOUNTER — Ambulatory Visit (HOSPITAL_COMMUNITY)
Admission: RE | Admit: 2023-03-05 | Discharge: 2023-03-05 | Disposition: A | Discharge status: Home / Self Care | Payer: Medicaid Other | Source: Ambulatory Visit | Attending: Family Medicine | Admitting: Family Medicine

## 2023-03-05 ENCOUNTER — Encounter (HOSPITAL_COMMUNITY): Payer: Self-pay

## 2023-03-05 ENCOUNTER — Other Ambulatory Visit: Payer: Self-pay | Admitting: Family Medicine

## 2023-03-05 DIAGNOSIS — Z1231 Encounter for screening mammogram for malignant neoplasm of breast: Secondary | ICD-10-CM | POA: Diagnosis not present

## 2023-03-05 MED ORDER — VITAMIN D3 25 MCG (1000 UT) PO CAPS: 1000.0000 [IU] | ORAL_CAPSULE | Freq: Every day | ORAL | 3 refills | Status: DC

## 2023-03-08 ENCOUNTER — Other Ambulatory Visit: Payer: Self-pay | Admitting: Family Medicine

## 2023-03-09 ENCOUNTER — Encounter (HOSPITAL_COMMUNITY): Payer: Self-pay | Admitting: Psychiatry

## 2023-03-09 ENCOUNTER — Telehealth (INDEPENDENT_AMBULATORY_CARE_PROVIDER_SITE_OTHER): Payer: No Typology Code available for payment source | Admitting: Psychiatry

## 2023-03-09 DIAGNOSIS — F4001 Agoraphobia with panic disorder: Secondary | ICD-10-CM | POA: Diagnosis not present

## 2023-03-09 DIAGNOSIS — G8929 Other chronic pain: Secondary | ICD-10-CM

## 2023-03-09 DIAGNOSIS — Z79899 Other long term (current) drug therapy: Secondary | ICD-10-CM

## 2023-03-09 DIAGNOSIS — F5081 Binge eating disorder: Secondary | ICD-10-CM

## 2023-03-09 DIAGNOSIS — S069X9A Unspecified intracranial injury with loss of consciousness of unspecified duration, initial encounter: Secondary | ICD-10-CM

## 2023-03-09 DIAGNOSIS — F250 Schizoaffective disorder, bipolar type: Secondary | ICD-10-CM

## 2023-03-09 DIAGNOSIS — F1211 Cannabis abuse, in remission: Secondary | ICD-10-CM

## 2023-03-09 DIAGNOSIS — F5105 Insomnia due to other mental disorder: Secondary | ICD-10-CM

## 2023-03-09 DIAGNOSIS — Z87820 Personal history of traumatic brain injury: Secondary | ICD-10-CM | POA: Diagnosis not present

## 2023-03-09 DIAGNOSIS — E559 Vitamin D deficiency, unspecified: Secondary | ICD-10-CM

## 2023-03-09 DIAGNOSIS — F50819 Binge eating disorder, unspecified: Secondary | ICD-10-CM

## 2023-03-09 DIAGNOSIS — F99 Mental disorder, not otherwise specified: Secondary | ICD-10-CM

## 2023-03-09 DIAGNOSIS — F4310 Post-traumatic stress disorder, unspecified: Secondary | ICD-10-CM

## 2023-03-09 DIAGNOSIS — M549 Dorsalgia, unspecified: Secondary | ICD-10-CM

## 2023-03-09 DIAGNOSIS — F1411 Cocaine abuse, in remission: Secondary | ICD-10-CM

## 2023-03-09 DIAGNOSIS — F411 Generalized anxiety disorder: Secondary | ICD-10-CM

## 2023-03-09 MED ORDER — PALIPERIDONE PALMITATE ER 117 MG/0.75ML IM SUSY
117.0000 mg | PREFILLED_SYRINGE | INTRAMUSCULAR | 3 refills | Status: DC
Start: 2023-03-09 — End: 2023-07-13

## 2023-03-09 MED ORDER — DIVALPROEX SODIUM ER 500 MG PO TB24: 500.0000 mg | ORAL_TABLET | Freq: Two times a day (BID) | ORAL | 2 refills | Status: DC

## 2023-03-09 MED ORDER — NALTREXONE HCL 50 MG PO TABS
50.0000 mg | ORAL_TABLET | Freq: Every day | ORAL | 2 refills | Status: DC
Start: 2023-03-09 — End: 2023-05-18

## 2023-03-09 MED ORDER — QUETIAPINE FUMARATE ER 50 MG PO TB24: 100.0000 mg | ORAL_TABLET | Freq: Every day | ORAL | 2 refills | Status: DC

## 2023-03-09 MED ORDER — NALTREXONE HCL 50 MG PO TABS
50.0000 mg | ORAL_TABLET | Freq: Every day | ORAL | 2 refills | Status: DC
Start: 2023-03-09 — End: 2023-03-09

## 2023-03-09 NOTE — Patient Instructions (Signed)
We restarted Seroquel at 100 mg nightly (2 pills) and added naltrexone 50 mg daily to her regimen today.  The Seroquel should help you sleep and get through this anniversary which is very difficult.  This should also help with the hallucinations that have come back up.  The naltrexone should help with the cravings that you have been experiencing.
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
## 2023-03-10 ENCOUNTER — Telehealth: Payer: Self-pay | Admitting: Family Medicine

## 2023-03-10 NOTE — Telephone Encounter (Signed)
Spoke with pharmacy. Per Paula Medina's last note about HTN meds she is to be taking amlodipine and losartan-HCTZ.

## 2023-03-10 NOTE — Telephone Encounter (Signed)
[facility] called in regards to pt. Wanted confirmation on what blood pressure medicine she is currently taking.

## 2023-03-12 ENCOUNTER — Encounter: Payer: Self-pay | Admitting: Orthopedic Surgery

## 2023-03-12 ENCOUNTER — Ambulatory Visit: Payer: Medicaid Other | Admitting: Orthopedic Surgery

## 2023-03-12 VITALS — BP 122/68 | HR 115 | Ht 66.0 in | Wt 208.0 lb

## 2023-03-12 DIAGNOSIS — M545 Low back pain, unspecified: Secondary | ICD-10-CM | POA: Diagnosis not present

## 2023-03-12 DIAGNOSIS — G8929 Other chronic pain: Secondary | ICD-10-CM | POA: Diagnosis not present

## 2023-03-12 DIAGNOSIS — M4316 Spondylolisthesis, lumbar region: Secondary | ICD-10-CM | POA: Diagnosis not present

## 2023-03-12 NOTE — Progress Notes (Signed)
back
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
## 2023-03-12 NOTE — Patient Instructions (Signed)
Go for physical therapy Take the medication you are on for your back.  You want to avoid narcotic pain medications, it is not used for your problem and will not help you.

## 2023-03-24 ENCOUNTER — Telehealth (HOSPITAL_COMMUNITY): Payer: Self-pay | Admitting: *Deleted

## 2023-03-24 NOTE — Telephone Encounter (Signed)
Opened in Error.

## 2023-03-27 ENCOUNTER — Ambulatory Visit (INDEPENDENT_AMBULATORY_CARE_PROVIDER_SITE_OTHER): Payer: No Typology Code available for payment source | Admitting: *Deleted

## 2023-03-27 VITALS — BP 122/88 | HR 81 | Ht 66.0 in | Wt 204.8 lb

## 2023-03-27 DIAGNOSIS — F250 Schizoaffective disorder, bipolar type: Secondary | ICD-10-CM | POA: Diagnosis not present

## 2023-03-27 DIAGNOSIS — Z79899 Other long term (current) drug therapy: Secondary | ICD-10-CM | POA: Diagnosis not present

## 2023-03-27 NOTE — Patient Instructions (Addendum)
Please follow up in 4 weeks.

## 2023-03-27 NOTE — Progress Notes (Signed)
Patient came into office to get her Invega Sustenna 156mg/mL injection. Injection was given to her in the Right Deltoid. Patient did not have any C/O for this injection she received today and from previous injection.    

## 2023-04-07 ENCOUNTER — Telehealth (HOSPITAL_COMMUNITY): Payer: No Typology Code available for payment source | Admitting: Psychiatry

## 2023-04-08 ENCOUNTER — Ambulatory Visit (HOSPITAL_COMMUNITY): Payer: MEDICAID

## 2023-04-17 ENCOUNTER — Encounter (HOSPITAL_COMMUNITY): Payer: Self-pay | Admitting: Psychiatry

## 2023-04-17 ENCOUNTER — Telehealth (HOSPITAL_COMMUNITY): Payer: MEDICAID | Admitting: Psychiatry

## 2023-04-17 DIAGNOSIS — Z79899 Other long term (current) drug therapy: Secondary | ICD-10-CM

## 2023-04-17 DIAGNOSIS — E559 Vitamin D deficiency, unspecified: Secondary | ICD-10-CM

## 2023-04-17 DIAGNOSIS — F4310 Post-traumatic stress disorder, unspecified: Secondary | ICD-10-CM | POA: Diagnosis not present

## 2023-04-17 DIAGNOSIS — F5105 Insomnia due to other mental disorder: Secondary | ICD-10-CM

## 2023-04-17 DIAGNOSIS — F411 Generalized anxiety disorder: Secondary | ICD-10-CM

## 2023-04-17 DIAGNOSIS — F5081 Binge eating disorder: Secondary | ICD-10-CM

## 2023-04-17 DIAGNOSIS — F99 Mental disorder, not otherwise specified: Secondary | ICD-10-CM

## 2023-04-17 DIAGNOSIS — F4001 Agoraphobia with panic disorder: Secondary | ICD-10-CM

## 2023-04-17 DIAGNOSIS — F250 Schizoaffective disorder, bipolar type: Secondary | ICD-10-CM | POA: Diagnosis not present

## 2023-04-17 MED ORDER — QUETIAPINE FUMARATE ER 200 MG PO TB24: 200.0000 mg | ORAL_TABLET | Freq: Every day | ORAL | 2 refills | Status: DC
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
## 2023-04-24 ENCOUNTER — Ambulatory Visit (HOSPITAL_COMMUNITY): Payer: No Typology Code available for payment source | Admitting: Psychiatry

## 2023-04-30 ENCOUNTER — Ambulatory Visit (INDEPENDENT_AMBULATORY_CARE_PROVIDER_SITE_OTHER): Payer: MEDICAID | Admitting: *Deleted

## 2023-04-30 VITALS — BP 103/71 | HR 99 | Ht 66.0 in | Wt 197.2 lb

## 2023-04-30 DIAGNOSIS — Z79899 Other long term (current) drug therapy: Secondary | ICD-10-CM | POA: Diagnosis not present

## 2023-04-30 DIAGNOSIS — F250 Schizoaffective disorder, bipolar type: Secondary | ICD-10-CM | POA: Diagnosis not present

## 2023-04-30 NOTE — Progress Notes (Signed)
Patient came into office to get her Paula Medina 156mg /mL injection. Injection was given to her in the Left Deltoid. Patient did not have any C/O for this injection she received today and from previous injection.

## 2023-04-30 NOTE — Patient Instructions (Signed)
Follow-up in 4 weeks

## 2023-05-12 ENCOUNTER — Encounter: Payer: Self-pay | Admitting: *Deleted

## 2023-05-18 ENCOUNTER — Telehealth (HOSPITAL_COMMUNITY): Payer: MEDICAID | Admitting: Psychiatry

## 2023-05-18 ENCOUNTER — Telehealth (HOSPITAL_COMMUNITY): Payer: Self-pay

## 2023-05-18 ENCOUNTER — Encounter (HOSPITAL_COMMUNITY): Payer: Self-pay | Admitting: Psychiatry

## 2023-05-18 DIAGNOSIS — F411 Generalized anxiety disorder: Secondary | ICD-10-CM

## 2023-05-18 DIAGNOSIS — F4001 Agoraphobia with panic disorder: Secondary | ICD-10-CM | POA: Diagnosis not present

## 2023-05-18 DIAGNOSIS — F99 Mental disorder, not otherwise specified: Secondary | ICD-10-CM

## 2023-05-18 DIAGNOSIS — F1211 Cannabis abuse, in remission: Secondary | ICD-10-CM

## 2023-05-18 DIAGNOSIS — F4310 Post-traumatic stress disorder, unspecified: Secondary | ICD-10-CM | POA: Diagnosis not present

## 2023-05-18 DIAGNOSIS — F1411 Cocaine abuse, in remission: Secondary | ICD-10-CM

## 2023-05-18 DIAGNOSIS — G4700 Insomnia, unspecified: Secondary | ICD-10-CM

## 2023-05-18 DIAGNOSIS — Z79899 Other long term (current) drug therapy: Secondary | ICD-10-CM

## 2023-05-18 DIAGNOSIS — F250 Schizoaffective disorder, bipolar type: Secondary | ICD-10-CM

## 2023-05-18 DIAGNOSIS — F50814 Binge eating disorder, in remission: Secondary | ICD-10-CM

## 2023-05-18 DIAGNOSIS — G8929 Other chronic pain: Secondary | ICD-10-CM

## 2023-05-18 DIAGNOSIS — F5081 Binge eating disorder: Secondary | ICD-10-CM

## 2023-05-18 DIAGNOSIS — F5105 Insomnia due to other mental disorder: Secondary | ICD-10-CM

## 2023-05-18 DIAGNOSIS — S069X9A Unspecified intracranial injury with loss of consciousness of unspecified duration, initial encounter: Secondary | ICD-10-CM

## 2023-05-18 DIAGNOSIS — F1191 Opioid use, unspecified, in remission: Secondary | ICD-10-CM

## 2023-05-18 DIAGNOSIS — E559 Vitamin D deficiency, unspecified: Secondary | ICD-10-CM

## 2023-05-18 MED ORDER — NALTREXONE HCL 50 MG PO TABS
50.0000 mg | ORAL_TABLET | Freq: Every day | ORAL | 2 refills | Status: DC
Start: 2023-05-18 — End: 2023-08-03

## 2023-05-18 MED ORDER — QUETIAPINE FUMARATE ER 200 MG PO TB24: 200.0000 mg | ORAL_TABLET | Freq: Every day | ORAL | 2 refills | Status: DC

## 2023-05-18 MED ORDER — DIVALPROEX SODIUM ER 500 MG PO TB24: 500.0000 mg | ORAL_TABLET | Freq: Two times a day (BID) | ORAL | 2 refills | Status: DC

## 2023-05-18 NOTE — Telephone Encounter (Signed)
received a fax that a prior auth was needed for the quetiapine

## 2023-05-18 NOTE — Telephone Encounter (Signed)
went online to [email or web address] and submitted the prior auth . - pending 

## 2023-05-18 NOTE — Patient Instructions (Signed)
We did not make any medication changes today.  Keep up the good work and check in with your PCP about getting that sleep study.
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
## 2023-05-19 NOTE — Telephone Encounter (Signed)
PA # [id number] approved from 05/18/23 to 05/17/24. Pt aware

## 2023-06-01 ENCOUNTER — Ambulatory Visit (HOSPITAL_COMMUNITY): Payer: MEDICAID | Admitting: Psychiatry

## 2023-06-01 ENCOUNTER — Ambulatory Visit (INDEPENDENT_AMBULATORY_CARE_PROVIDER_SITE_OTHER): Payer: MEDICAID | Admitting: Psychiatry

## 2023-06-01 VITALS — BP 114/87 | HR 77 | Ht 66.0 in | Wt 194.6 lb

## 2023-06-01 DIAGNOSIS — Z79899 Other long term (current) drug therapy: Secondary | ICD-10-CM | POA: Diagnosis not present

## 2023-06-01 DIAGNOSIS — F250 Schizoaffective disorder, bipolar type: Secondary | ICD-10-CM | POA: Diagnosis not present

## 2023-06-01 NOTE — Progress Notes (Signed)
Patient came into office to get her Gean Birchwood 156mg /mL injection. Injection was given to her in the Right Deltoid. Patient did not have any C/O from this injection she received today and from previous injection.

## 2023-06-01 NOTE — Patient Instructions (Addendum)
Follow-up in 4 weeks

## 2023-06-10 ENCOUNTER — Other Ambulatory Visit: Payer: Self-pay | Admitting: Family Medicine

## 2023-06-10 DIAGNOSIS — G8929 Other chronic pain: Secondary | ICD-10-CM

## 2023-06-22 ENCOUNTER — Other Ambulatory Visit: Payer: Self-pay | Admitting: Family Medicine

## 2023-06-29 ENCOUNTER — Encounter: Payer: Self-pay | Admitting: Family Medicine

## 2023-06-29 ENCOUNTER — Ambulatory Visit: Payer: MEDICAID | Admitting: Family Medicine

## 2023-06-29 VITALS — BP 130/90 | HR 89 | Resp 20 | Ht 66.0 in | Wt 190.4 lb

## 2023-06-29 DIAGNOSIS — R7303 Prediabetes: Secondary | ICD-10-CM | POA: Diagnosis not present

## 2023-06-29 DIAGNOSIS — I10 Essential (primary) hypertension: Secondary | ICD-10-CM | POA: Diagnosis not present

## 2023-06-29 DIAGNOSIS — E782 Mixed hyperlipidemia: Secondary | ICD-10-CM | POA: Diagnosis not present

## 2023-06-29 DIAGNOSIS — E538 Deficiency of other specified B group vitamins: Secondary | ICD-10-CM

## 2023-06-29 DIAGNOSIS — E559 Vitamin D deficiency, unspecified: Secondary | ICD-10-CM

## 2023-06-29 DIAGNOSIS — M545 Low back pain, unspecified: Secondary | ICD-10-CM

## 2023-06-29 MED ORDER — TIZANIDINE HCL 6 MG PO CAPS: 6.0000 mg | ORAL_CAPSULE | Freq: Two times a day (BID) | ORAL | 2 refills | Status: DC | PRN

## 2023-06-29 NOTE — Assessment & Plan Note (Addendum)
Vitals:   06/29/23 0805 06/29/23 0823  BP: (!) 130/95 (!) 130/90   Patient reports did not take medication this morning because she is fasting for blood work and takes medication with food At home blood pressure ranges from 110-130's/70-85's Continue Amlodipine 10 mg daily, and Losartan-hydrochlorothiazide 100-25 daily, Labs ordered in today's visit. Continued discussion on DASH diet, low sodium diet and maintain a exercise routine for 150 minutes per week.

## 2023-06-29 NOTE — Patient Instructions (Signed)

## 2023-06-29 NOTE — Progress Notes (Signed)
Patient Office Visit   Subjective   Patient ID: Paula Medina, female    DOB: 01-17-1976  Age: 47 y.o. MRN: [id number]  CC:  Chief Complaint  Patient presents with   Follow-up    HPI Paula Medina 47 year old female, presents to the clinic for HTN follow up. 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  Hypertension This is a chronic problem. Pertinent negatives include no blurred vision, chest pain, headaches, palpitations or shortness of breath. Risk factors for coronary artery disease include obesity, dyslipidemia and sedentary lifestyle. Past treatments include angiotensin blockers and calcium channel blockers. The current treatment provides moderate improvement. Compliance problems include diet and exercise.  There is no history of kidney disease. There is no history of chronic renal disease or sleep apnea.      Outpatient Encounter Medications as of 06/29/2023  Medication Sig   amLODipine (NORVASC) 10 MG tablet Take 1 tablet (10 mg total) by mouth daily.   Cholecalciferol (VITAMIN D3) 25 MCG (1000 UT) CAPS Take 1 capsule (1,000 Units total) by mouth daily.   divalproex (DEPAKOTE ER) 500 MG 24 hr tablet Take 1 tablet (500 mg total) by mouth in the morning and at bedtime. TAKE 1 TABLET BY MOUTH TWICE DAILY   gabapentin (NEURONTIN) 400 MG capsule TAKE ONE CAPSULE BY MOUTH THREE TIMES DAILY   losartan-hydrochlorothiazide (HYZAAR) 100-25 MG tablet Take 1 tablet by mouth daily.   naltrexone (DEPADE) 50 MG tablet Take 1 tablet (50 mg total) by mouth daily.   naproxen (NAPROSYN) 500 MG tablet Take 1 tablet (500 mg total) by mouth 2 (two) times daily.   Paliperidone ER (INVEGA SUSTENNA) injection Inject 117 mg into the muscle every 30 (thirty) days.   polyethylene glycol powder (GLYCOLAX/MIRALAX) 17 GM/SCOOP powder Take 17 g  by mouth once as needed for up to 1 dose.   QUEtiapine (SEROQUEL XR) 200 MG 24 hr tablet Take 1 tablet (200 mg total) by mouth at bedtime.   rosuvastatin (CRESTOR) 10 MG tablet TAKE 1 TABLET BY MOUTH EVERY DAY   tizanidine (ZANAFLEX) 6 MG capsule Take 1 capsule (6 mg total) by mouth 2 (two) times daily as needed for muscle spasms.   [DISCONTINUED] tiZANidine (ZANAFLEX) 4 MG tablet Take by mouth at bedtime.   Facility-Administered Encounter Medications as of 06/29/2023  Medication   Paliperidone ER (INVEGA SUSTENNA) injection 117 mg    Past Surgical History:  Procedure Laterality Date   CESAREAN SECTION     KNEE SURGERY      Review of Systems  Constitutional:  Negative for chills and fever.  Eyes:  Negative for blurred vision.  Respiratory:  Negative for shortness of breath.   Cardiovascular:  Negative for chest pain and palpitations.  Gastrointestinal:  Negative for abdominal pain.  Genitourinary:  Negative for dysuria.  Musculoskeletal:  Positive for myalgias.  Neurological:  Negative for headaches.      Objective    BP (!) 130/90   Pulse 89   Resp 20   Ht 5\' 6"  (1.676 m)   Wt 190 lb 6.4 oz (86.4 kg)   SpO2 95%   BMI 30.73 kg/m   Physical Exam Vitals reviewed.  Constitutional:      General: She is not in acute distress.    Appearance: Normal appearance. She is not ill-appearing, toxic-appearing or diaphoretic.  HENT:     Head:  Normocephalic.  Eyes:     General:        Right eye: No discharge.        Left eye: No discharge.     Conjunctiva/sclera: Conjunctivae normal.  Cardiovascular:     Rate and Rhythm: Normal rate.     Pulses: Normal pulses.     Heart sounds: Normal heart sounds.  Pulmonary:     Effort: Pulmonary effort is normal. No respiratory distress.     Breath sounds: Normal breath sounds.  Abdominal:     General: Bowel sounds are normal.     Palpations: Abdomen is soft.     Tenderness: There is no abdominal tenderness. There is no right CVA  tenderness, left CVA tenderness or guarding.  Musculoskeletal:        General: Normal range of motion.     Cervical back: Normal range of motion.  Skin:    General: Skin is warm and dry.  Neurological:     General: No focal deficit present.     Mental Status: She is alert.     Coordination: Coordination normal.     Gait: Gait normal.  Psychiatric:        Mood and Affect: Mood normal.       Assessment & Plan:  Primary hypertension Assessment & Plan: Vitals:   06/29/23 0805 06/29/23 0823  BP: (!) 130/95 (!) 130/90   Patient reports did not take medication this morning because she is fasting for blood work and takes medication with food At home blood pressure ranges from 110-130's/70-85's Continue Amlodipine 10 mg daily, and Losartan-hydrochlorothiazide 100-25 daily, Labs ordered in today's visit. Continued discussion on DASH diet, low sodium diet and maintain a exercise routine for 150 minutes per week.   Orders: -     BMP8+eGFR -     CBC with Differential/Platelet  Mixed hyperlipidemia -     Lipid panel  Pre-diabetes -     Hemoglobin A1c  Vitamin B12 deficiency -     Vitamin B12  Vitamin D deficiency -     VITAMIN D 25 Hydroxy (Vit-D Deficiency, Fractures)  Lumbar pain -     Ambulatory referral to Physical Therapy  Other orders -     tiZANidine HCl; Take 1 capsule (6 mg total) by mouth 2 (two) times daily as needed for muscle spasms.  Dispense: 60 capsule; Refill: 2    Return in about 4 months (around 10/29/2023) for pre-diabetes, hypertension.   Cruzita Lederer Newman Nip, FNP

## 2023-06-30 LAB — CBC WITH DIFFERENTIAL/PLATELET
Basophils Absolute: 0 10*3/uL (ref 0.0–0.2)
Basos: 0 %
EOS (ABSOLUTE): 0 10*3/uL (ref 0.0–0.4)
Eos: 0 %
Hematocrit: 39.6 % (ref 34.0–46.6)
Hemoglobin: 13.6 g/dL (ref 11.1–15.9)
Immature Grans (Abs): 0 10*3/uL (ref 0.0–0.1)
Immature Granulocytes: 0 %
Lymphocytes Absolute: 3.1 10*3/uL (ref 0.7–3.1)
Lymphs: 31 %
MCH: 30.2 pg (ref 26.6–33.0)
MCHC: 34.3 g/dL (ref 31.5–35.7)
MCV: 88 fL (ref 79–97)
Monocytes Absolute: 1.4 10*3/uL — ABNORMAL HIGH (ref 0.1–0.9)
Monocytes: 14 %
Neutrophils Absolute: 5.4 10*3/uL (ref 1.4–7.0)
Neutrophils: 55 %
Platelets: 196 10*3/uL (ref 150–450)
RBC: 4.5 x10E6/uL (ref 3.77–5.28)
RDW: 14.4 % (ref 11.7–15.4)
WBC: 10.1 10*3/uL (ref 3.4–10.8)

## 2023-06-30 LAB — BMP8+EGFR
BUN/Creatinine Ratio: 8 — ABNORMAL LOW (ref 9–23)
BUN: 8 mg/dL (ref 6–24)
CO2: 25 mmol/L (ref 20–29)
Calcium: 9.7 mg/dL (ref 8.7–10.2)
Chloride: 100 mmol/L (ref 96–106)
Creatinine, Ser: 0.96 mg/dL (ref 0.57–1.00)
Glucose: 85 mg/dL (ref 70–99)
Potassium: 3.2 mmol/L — ABNORMAL LOW (ref 3.5–5.2)
Sodium: 144 mmol/L (ref 134–144)
eGFR: 73 mL/min/{1.73_m2} (ref >59)

## 2023-06-30 LAB — LIPID PANEL
Chol/HDL Ratio: 3.2 ratio (ref 0.0–4.4)
Cholesterol, Total: 147 mg/dL (ref 100–199)
HDL: 46 mg/dL (ref >39)
LDL Chol Calc (NIH): 83 mg/dL (ref 0–99)
Triglycerides: 99 mg/dL (ref 0–149)
VLDL Cholesterol Cal: 18 mg/dL (ref 5–40)

## 2023-06-30 LAB — VITAMIN B12: Vitamin B-12: 594 pg/mL (ref 232–1245)

## 2023-06-30 LAB — HEMOGLOBIN A1C
Est. average glucose Bld gHb Est-mCnc: 120 mg/dL
Hgb A1c MFr Bld: 5.8 % — ABNORMAL HIGH (ref 4.8–5.6)

## 2023-06-30 LAB — VITAMIN D 25 HYDROXY (VIT D DEFICIENCY, FRACTURES): Vit D, 25-Hydroxy: 10 ng/mL — ABNORMAL LOW (ref 30.0–100.0)

## 2023-07-01 ENCOUNTER — Other Ambulatory Visit: Payer: Self-pay | Admitting: Family Medicine

## 2023-07-01 DIAGNOSIS — E878 Other disorders of electrolyte and fluid balance, not elsewhere classified: Secondary | ICD-10-CM

## 2023-07-01 MED ORDER — VITAMIN D3 25 MCG (1000 UT) PO CAPS: 1000.0000 [IU] | ORAL_CAPSULE | Freq: Every day | ORAL | 3 refills | Status: DC

## 2023-07-02 ENCOUNTER — Ambulatory Visit (HOSPITAL_COMMUNITY): Payer: MEDICAID | Admitting: Psychiatry

## 2023-07-09 ENCOUNTER — Ambulatory Visit (INDEPENDENT_AMBULATORY_CARE_PROVIDER_SITE_OTHER): Payer: MEDICAID | Admitting: *Deleted

## 2023-07-09 VITALS — BP 113/76 | HR 96 | Ht 66.0 in | Wt 187.8 lb

## 2023-07-09 DIAGNOSIS — Z79899 Other long term (current) drug therapy: Secondary | ICD-10-CM | POA: Diagnosis not present

## 2023-07-09 DIAGNOSIS — F250 Schizoaffective disorder, bipolar type: Secondary | ICD-10-CM | POA: Diagnosis not present

## 2023-07-09 NOTE — Progress Notes (Addendum)
Patient came in a bit late to get her September Injection. Patient did not have any C/O. Pateint was in good spirits today. Patient was given Gean Birchwood 117mg /mL injection. Injection was given to her in the Left Deltoid. No chief complaints. Patient stated she lost weight and is happy about that.

## 2023-07-09 NOTE — Patient Instructions (Signed)
Follow-up in 4 weeks

## 2023-07-13 ENCOUNTER — Other Ambulatory Visit (HOSPITAL_COMMUNITY): Payer: Self-pay | Admitting: Psychiatry

## 2023-07-13 DIAGNOSIS — Z79899 Other long term (current) drug therapy: Secondary | ICD-10-CM

## 2023-07-13 DIAGNOSIS — F250 Schizoaffective disorder, bipolar type: Secondary | ICD-10-CM

## 2023-07-17 ENCOUNTER — Ambulatory Visit (HOSPITAL_COMMUNITY): Payer: MEDICAID

## 2023-07-20 ENCOUNTER — Telehealth (HOSPITAL_COMMUNITY): Payer: MEDICAID | Admitting: Psychiatry

## 2023-07-21 ENCOUNTER — Ambulatory Visit (HOSPITAL_COMMUNITY): Payer: MEDICAID

## 2023-07-23 ENCOUNTER — Ambulatory Visit: Payer: MEDICAID | Admitting: Family Medicine

## 2023-07-23 ENCOUNTER — Encounter: Payer: Self-pay | Admitting: Family Medicine

## 2023-07-23 VITALS — BP 117/84 | HR 67 | Ht 66.0 in | Wt 188.1 lb

## 2023-07-23 DIAGNOSIS — J069 Acute upper respiratory infection, unspecified: Secondary | ICD-10-CM | POA: Diagnosis not present

## 2023-07-23 MED ORDER — NOREL AD 4-10-325 MG PO TABS
ORAL_TABLET | ORAL | 0 refills | Status: AC
Start: 2023-07-23 — End: ?

## 2023-07-23 MED ORDER — BENZONATATE 200 MG PO CAPS
200.0000 mg | ORAL_CAPSULE | Freq: Two times a day (BID) | ORAL | 0 refills | Status: DC | PRN
Start: 2023-07-23 — End: 2023-09-28

## 2023-07-23 MED ORDER — AMOXICILLIN-POT CLAVULANATE 875-125 MG PO TABS
1.0000 | ORAL_TABLET | Freq: Two times a day (BID) | ORAL | 0 refills | Status: AC
Start: 2023-07-23 — End: 2023-07-30

## 2023-07-23 MED ORDER — PREDNISONE 20 MG PO TABS
20.0000 mg | ORAL_TABLET | Freq: Two times a day (BID) | ORAL | 0 refills | Status: AC
Start: 2023-07-23 — End: 2023-07-28

## 2023-07-23 MED ORDER — ALBUTEROL SULFATE HFA 108 (90 BASE) MCG/ACT IN AERS: 2.0000 | INHALATION_SPRAY | Freq: Four times a day (QID) | RESPIRATORY_TRACT | 2 refills | Status: DC | PRN

## 2023-07-23 NOTE — Assessment & Plan Note (Signed)
Prednisone 20 mg twice day x 5 days , Benzonatate 200 mg PRN,  Augmentin 875-125 mg twice daily x 7 days Albuterol inhaler PRN Advise patient to rest to support your body's recovery. Stay hydrated by drinking water, tea, or broth. Using a humidifier can help soothe throat irritation and ease nasal congestion. For fever or pain, acetaminophen (Tylenol) is recommended. To relieve other symptoms, try saline nasal sprays, throat lozenges, or gargling with saltwater. Focus on eating light, healthy meals like fruits and vegetables to keep your strength up. Practice good hygiene by washing your hands frequently and covering your mouth when coughing or sneezing.Follow-up for worsening or persistent symptoms. Patient verbalizes understanding regarding plan of care and all questions answered

## 2023-07-23 NOTE — Progress Notes (Signed)
Patient Office Visit   Subjective   Patient ID: Paula Medina, female    DOB: 08-23-1976  Age: 47 y.o. 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  The patient presents with a fever of 103F and reports symptoms of shortness of breath at rest, cough, fatigue, headache, malaise, myalgias, sore throat, sputum production, and night sweats. These symptoms began several days ago and have progressively worsened. The patient denies experiencing chest pain, nausea, or vomiting. Current treatment includes over-the-counter analgesics and antipyretics, which have provided minimal relief, as well as antitussives, which have also been ineffective. The patient's pulmonary history is notable for previous episodes of pneumonia and bronchitis.      Outpatient Encounter Medications as of 07/23/2023  Medication Sig   albuterol (VENTOLIN HFA) 108 (90 Base) MCG/ACT inhaler Inhale 2 puffs into the lungs every 6 (six) hours as needed for wheezing or shortness of breath.   amLODipine (NORVASC) 10 MG tablet Take 1 tablet (10 mg total) by mouth daily.   amoxicillin-clavulanate (AUGMENTIN) 875-125 MG tablet Take 1 tablet by mouth 2 (two) times daily for 7 days.   benzonatate (TESSALON) 200 MG capsule Take 1 capsule (200 mg total) by mouth 2 (two) times daily as needed for cough.   Chlorphen-PE-Acetaminophen (NOREL AD) 4-10-325 MG TABS Take 1 tablet every 4 hours while symptoms persists. Do not take more than 6 tablets in 24 hours.   Cholecalciferol (VITAMIN D3) 25 MCG (1000 UT) CAPS Take 1 capsule (1,000 Units total) by mouth daily.   divalproex  (DEPAKOTE ER) 500 MG 24 hr tablet Take 1 tablet (500 mg total) by mouth in the morning and at bedtime. TAKE 1 TABLET BY MOUTH TWICE DAILY   gabapentin (NEURONTIN) 400 MG capsule TAKE ONE CAPSULE BY MOUTH THREE TIMES DAILY   INVEGA SUSTENNA injection INJECT 117 MG INTO THE MUSCLE EVERY 30 DAYS   losartan-hydrochlorothiazide (HYZAAR) 100-25 MG tablet Take 1 tablet by mouth daily.   naltrexone (DEPADE) 50 MG tablet Take 1 tablet (50 mg total) by mouth daily.   naproxen (NAPROSYN) 500 MG tablet Take 1 tablet (500 mg total) by mouth 2 (two) times daily.   polyethylene glycol powder (GLYCOLAX/MIRALAX) 17 GM/SCOOP powder Take 17 g by mouth once as needed for up to 1 dose.   predniSONE (DELTASONE) 20 MG tablet Take 1 tablet (20 mg total) by mouth 2 (two) times daily with a meal for 5 days.   QUEtiapine (SEROQUEL XR) 200 MG 24 hr tablet Take 1 tablet (200 mg total) by mouth at bedtime.   rosuvastatin (CRESTOR) 10 MG tablet TAKE 1 TABLET BY MOUTH EVERY DAY   tizanidine (ZANAFLEX) 6 MG capsule Take 1 capsule (6 mg total) by mouth 2 (two) times daily as needed for muscle spasms.   Facility-Administered Encounter Medications as of 07/23/2023  Medication   Paliperidone ER (INVEGA SUSTENNA) injection 117 mg    Past Surgical History:  Procedure Laterality Date   CESAREAN SECTION     KNEE SURGERY      Review of Systems  Constitutional:  Positive for chills, fever and malaise/fatigue.  HENT:  Positive for congestion, sinus  pain and sore throat.   Eyes:  Negative for blurred vision.  Respiratory:  Positive for cough, sputum production and shortness of breath. Negative for hemoptysis and wheezing.   Cardiovascular:  Negative for chest pain.  Genitourinary:  Negative for dysuria.  Musculoskeletal:  Positive for myalgias.  Neurological:  Positive for headaches. Negative for dizziness.      Objective    BP 117/84   Pulse 67   Ht 5\' 6"  (1.676 m)   Wt 188 lb 1.3 oz (85.3 kg)   SpO2 96%   BMI 30.36  kg/m   Physical Exam Vitals reviewed.  Constitutional:      General: She is not in acute distress.    Appearance: Normal appearance. She is not ill-appearing, toxic-appearing or diaphoretic.  HENT:     Head: Normocephalic.     Nose: Congestion and rhinorrhea present.     Mouth/Throat:     Pharynx: Posterior oropharyngeal erythema present.  Eyes:     General:        Right eye: No discharge.        Left eye: No discharge.     Conjunctiva/sclera: Conjunctivae normal.  Cardiovascular:     Rate and Rhythm: Normal rate.     Pulses: Normal pulses.     Heart sounds: Normal heart sounds.  Pulmonary:     Breath sounds: Rhonchi present.  Musculoskeletal:        General: Normal range of motion.     Cervical back: Normal range of motion.  Skin:    General: Skin is warm and dry.     Capillary Refill: Capillary refill takes less than 2 seconds.  Neurological:     Mental Status: She is alert.     Coordination: Coordination normal.     Gait: Gait normal.  Psychiatric:        Mood and Affect: Mood normal.       Assessment & Plan:  Upper respiratory tract infection, unspecified type Assessment & Plan: Prednisone 20 mg twice day x 5 days , Benzonatate 200 mg PRN,  Augmentin 875-125 mg twice daily x 7 days Albuterol inhaler PRN Advise patient to rest to support your body's recovery. Stay hydrated by drinking water, tea, or broth. Using a humidifier can help soothe throat irritation and ease nasal congestion. For fever or pain, acetaminophen (Tylenol) is recommended. To relieve other symptoms, try saline nasal sprays, throat lozenges, or gargling with saltwater. Focus on eating light, healthy meals like fruits and vegetables to keep your strength up. Practice good hygiene by washing your hands frequently and covering your mouth when coughing or sneezing.Follow-up for worsening or persistent symptoms. Patient verbalizes understanding regarding plan of care and all questions answered     Orders: -     predniSONE; Take 1 tablet (20 mg total) by mouth 2 (two) times daily with a meal for 5 days.  Dispense: 10 tablet; Refill: 0 -     Norel AD; Take 1 tablet every 4 hours while symptoms persists. Do not take more than 6 tablets in 24 hours.  Dispense: 20 tablet; Refill: 0 -     Amoxicillin-Pot Clavulanate; Take 1 tablet by mouth 2 (two) times daily for 7 days.  Dispense: 14 tablet; Refill: 0 -     Benzonatate; Take 1 capsule (200 mg total) by mouth 2 (two) times daily as needed for cough.  Dispense: 20 capsule; Refill: 0  Other orders -     Albuterol Sulfate HFA; Inhale 2 puffs into  the lungs every 6 (six) hours as needed for wheezing or shortness of breath.  Dispense: 8 g; Refill: 2    Return if symptoms worsen or fail to improve.   Cruzita Lederer Newman Nip, FNP

## 2023-07-23 NOTE — Patient Instructions (Signed)
        Great to see you today.  I have refilled the medication(s) we provide.    - Please take medications as prescribed. - Follow up with your primary health provider if any health concerns arises. - If symptoms worsen please contact your primary care provider and/or visit the emergency department.  

## 2023-08-03 ENCOUNTER — Encounter (HOSPITAL_COMMUNITY): Payer: Self-pay | Admitting: Psychiatry

## 2023-08-03 ENCOUNTER — Telehealth (INDEPENDENT_AMBULATORY_CARE_PROVIDER_SITE_OTHER): Payer: MEDICAID | Admitting: Psychiatry

## 2023-08-03 DIAGNOSIS — F1211 Cannabis abuse, in remission: Secondary | ICD-10-CM | POA: Diagnosis not present

## 2023-08-03 DIAGNOSIS — F250 Schizoaffective disorder, bipolar type: Secondary | ICD-10-CM

## 2023-08-03 DIAGNOSIS — S069X9A Unspecified intracranial injury with loss of consciousness of unspecified duration, initial encounter: Secondary | ICD-10-CM

## 2023-08-03 DIAGNOSIS — F50814 Binge eating disorder, in remission: Secondary | ICD-10-CM

## 2023-08-03 DIAGNOSIS — F5105 Insomnia due to other mental disorder: Secondary | ICD-10-CM

## 2023-08-03 DIAGNOSIS — F4001 Agoraphobia with panic disorder: Secondary | ICD-10-CM

## 2023-08-03 DIAGNOSIS — M549 Dorsalgia, unspecified: Secondary | ICD-10-CM

## 2023-08-03 DIAGNOSIS — F1411 Cocaine abuse, in remission: Secondary | ICD-10-CM

## 2023-08-03 DIAGNOSIS — Z87828 Personal history of other (healed) physical injury and trauma: Secondary | ICD-10-CM

## 2023-08-03 DIAGNOSIS — F4310 Post-traumatic stress disorder, unspecified: Secondary | ICD-10-CM

## 2023-08-03 DIAGNOSIS — F99 Mental disorder, not otherwise specified: Secondary | ICD-10-CM

## 2023-08-03 DIAGNOSIS — G8929 Other chronic pain: Secondary | ICD-10-CM

## 2023-08-03 DIAGNOSIS — Z79899 Other long term (current) drug therapy: Secondary | ICD-10-CM

## 2023-08-03 MED ORDER — DIVALPROEX SODIUM ER 500 MG PO TB24
500.0000 mg | ORAL_TABLET | Freq: Two times a day (BID) | ORAL | 2 refills | Status: DC
Start: 2023-08-03 — End: 2023-09-28

## 2023-08-03 MED ORDER — QUETIAPINE FUMARATE ER 200 MG PO TB24: 200.0000 mg | ORAL_TABLET | Freq: Every day | ORAL | 2 refills | Status: DC

## 2023-08-03 MED ORDER — PALIPERIDONE PALMITATE ER 156 MG/ML IM SUSY
156.0000 mg | PREFILLED_SYRINGE | INTRAMUSCULAR | Status: AC
Start: 2023-08-10 — End: 2024-02-01
  Administered 2023-08-24 – 2024-02-01 (×6): 156 mg via INTRAMUSCULAR

## 2023-08-03 MED ORDER — NALTREXONE HCL 50 MG PO TABS: 100.0000 mg | ORAL_TABLET | Freq: Every day | ORAL | 2 refills | Status: DC

## 2023-08-03 MED ORDER — INVEGA SUSTENNA 156 MG/ML IM SUSY
156.0000 mg | PREFILLED_SYRINGE | INTRAMUSCULAR | 5 refills | Status: DC
Start: 2023-08-03 — End: 2023-12-15

## 2023-08-03 NOTE — Patient Instructions (Signed)
We increased the dose for your next Invega injection as well as increasing the dose for the naltrexone to assist with cravings.
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
## 2023-08-05 ENCOUNTER — Institutional Professional Consult (permissible substitution): Payer: MEDICAID | Admitting: Primary Care

## 2023-08-05 ENCOUNTER — Other Ambulatory Visit: Payer: Self-pay | Admitting: Family Medicine

## 2023-08-10 ENCOUNTER — Ambulatory Visit (HOSPITAL_COMMUNITY): Payer: MEDICAID | Admitting: Psychiatry

## 2023-08-18 ENCOUNTER — Ambulatory Visit (HOSPITAL_COMMUNITY): Payer: MEDICAID | Admitting: Psychiatry

## 2023-08-18 MED ORDER — PALIPERIDONE ER 3 MG PO TB24
3.0000 mg | ORAL_TABLET | Freq: Every day | ORAL | 0 refills | Status: DC
Start: 2023-08-18 — End: 2023-08-18

## 2023-08-18 MED ORDER — PALIPERIDONE PALMITATE ER 117 MG/0.75ML IM SUSY
117.0000 mg | PREFILLED_SYRINGE | Freq: Once | INTRAMUSCULAR | Status: DC
Start: 2023-08-18 — End: 2023-08-18

## 2023-08-18 NOTE — Progress Notes (Signed)
Opened in Error.

## 2023-08-19 ENCOUNTER — Other Ambulatory Visit: Payer: Self-pay | Admitting: Family Medicine

## 2023-08-19 ENCOUNTER — Ambulatory Visit (HOSPITAL_COMMUNITY): Payer: MEDICAID | Admitting: Psychiatry

## 2023-08-19 ENCOUNTER — Encounter (HOSPITAL_COMMUNITY): Payer: Self-pay

## 2023-08-20 ENCOUNTER — Other Ambulatory Visit: Payer: Self-pay | Admitting: Family Medicine

## 2023-08-24 ENCOUNTER — Encounter (HOSPITAL_COMMUNITY): Payer: Self-pay | Admitting: Psychiatry

## 2023-08-24 ENCOUNTER — Telehealth: Payer: Self-pay | Admitting: Family Medicine

## 2023-08-24 ENCOUNTER — Ambulatory Visit (INDEPENDENT_AMBULATORY_CARE_PROVIDER_SITE_OTHER): Payer: MEDICAID | Admitting: *Deleted

## 2023-08-24 VITALS — BP 119/85 | HR 82 | Ht 66.0 in | Wt 186.8 lb

## 2023-08-24 DIAGNOSIS — F250 Schizoaffective disorder, bipolar type: Secondary | ICD-10-CM

## 2023-08-24 NOTE — Patient Instructions (Signed)
Follow-up in 4 weeks

## 2023-08-24 NOTE — Telephone Encounter (Signed)
PCS forms were picked up

## 2023-08-24 NOTE — Progress Notes (Signed)
Patient came in a bit late to get her September Injection. Patient did not have any C/O. Pateint was in good spirits today. Patient was given Gean Birchwood 117mg /mL injection. Injection was given to her in the Right Deltoid. No chief complaints. Patient stated she lost weight and is happy about that.

## 2023-09-24 ENCOUNTER — Ambulatory Visit (HOSPITAL_COMMUNITY): Payer: MEDICAID | Admitting: Psychiatry

## 2023-09-25 ENCOUNTER — Telehealth (HOSPITAL_COMMUNITY): Payer: MEDICAID | Admitting: Psychiatry

## 2023-09-28 ENCOUNTER — Telehealth (HOSPITAL_COMMUNITY): Payer: MEDICAID | Admitting: Psychiatry

## 2023-09-28 ENCOUNTER — Encounter (HOSPITAL_COMMUNITY): Payer: Self-pay | Admitting: Psychiatry

## 2023-09-28 ENCOUNTER — Ambulatory Visit (INDEPENDENT_AMBULATORY_CARE_PROVIDER_SITE_OTHER): Payer: MEDICAID | Admitting: *Deleted

## 2023-09-28 VITALS — BP 128/89 | HR 71 | Ht 66.0 in | Wt 182.2 lb

## 2023-09-28 DIAGNOSIS — M549 Dorsalgia, unspecified: Secondary | ICD-10-CM

## 2023-09-28 DIAGNOSIS — F411 Generalized anxiety disorder: Secondary | ICD-10-CM

## 2023-09-28 DIAGNOSIS — F4310 Post-traumatic stress disorder, unspecified: Secondary | ICD-10-CM

## 2023-09-28 DIAGNOSIS — F250 Schizoaffective disorder, bipolar type: Secondary | ICD-10-CM

## 2023-09-28 DIAGNOSIS — F1411 Cocaine abuse, in remission: Secondary | ICD-10-CM | POA: Diagnosis not present

## 2023-09-28 DIAGNOSIS — F50814 Binge eating disorder, in remission: Secondary | ICD-10-CM

## 2023-09-28 DIAGNOSIS — F4001 Agoraphobia with panic disorder: Secondary | ICD-10-CM

## 2023-09-28 DIAGNOSIS — G8929 Other chronic pain: Secondary | ICD-10-CM

## 2023-09-28 DIAGNOSIS — Z79899 Other long term (current) drug therapy: Secondary | ICD-10-CM

## 2023-09-28 DIAGNOSIS — S069X9A Unspecified intracranial injury with loss of consciousness of unspecified duration, initial encounter: Secondary | ICD-10-CM

## 2023-09-28 DIAGNOSIS — F1211 Cannabis abuse, in remission: Secondary | ICD-10-CM | POA: Diagnosis not present

## 2023-09-28 DIAGNOSIS — F99 Mental disorder, not otherwise specified: Secondary | ICD-10-CM

## 2023-09-28 DIAGNOSIS — F5105 Insomnia due to other mental disorder: Secondary | ICD-10-CM

## 2023-09-28 MED ORDER — QUETIAPINE FUMARATE ER 200 MG PO TB24: 200.0000 mg | ORAL_TABLET | Freq: Every day | ORAL | 2 refills | Status: DC

## 2023-09-28 MED ORDER — DIVALPROEX SODIUM ER 500 MG PO TB24: 500.0000 mg | ORAL_TABLET | Freq: Two times a day (BID) | ORAL | 2 refills | Status: DC

## 2023-09-28 MED ORDER — NALTREXONE HCL 50 MG PO TABS: 100.0000 mg | ORAL_TABLET | Freq: Every day | ORAL | 2 refills | Status: DC

## 2023-09-28 NOTE — Patient Instructions (Signed)
Follow-up in 4 weeks
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
## 2023-09-28 NOTE — Progress Notes (Addendum)
Patient came in a bit late to get her December Injection. Patient did not have any C/O. Pateint was in good spirits today. Patient was given Gean Birchwood 117mg /mL injection. Injection was given to her in the Left Deltoid. No chief complaints.

## 2023-09-28 NOTE — Patient Instructions (Signed)
We did not make any medication changes today.  We will see if getting the Invega injection back in your system helps with some of the cravings but we may consider Vivitrol at your next appointment.

## 2023-10-20 ENCOUNTER — Other Ambulatory Visit: Payer: Self-pay | Admitting: Family Medicine

## 2023-10-28 ENCOUNTER — Ambulatory Visit (HOSPITAL_COMMUNITY): Payer: No Typology Code available for payment source | Admitting: Psychiatry

## 2023-10-28 NOTE — Patient Instructions (Signed)

## 2023-10-28 NOTE — Progress Notes (Unsigned)
   Established Patient Office Visit   Subjective  Patient ID: Paula Medina, female    DOB: [birth year]  Age: 48 y.o. MRN: [id number]  No chief complaint on file.   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  HPI  ROS    Objective:     There were no vitals taken for this visit. {Vitals History (Optional):23777}  Physical Exam   No results found for any visits on 10/29/23.  The 10-year ASCVD risk score (Arnett DK, et al., 2019) is: 2.9%    Assessment & Plan:  There are no diagnoses linked to this encounter.  No follow-ups on file.   Cruzita Lederer Newman Nip, FNP

## 2023-10-29 ENCOUNTER — Encounter: Payer: MEDICAID | Admitting: Family Medicine

## 2023-10-29 ENCOUNTER — Ambulatory Visit (HOSPITAL_COMMUNITY): Payer: No Typology Code available for payment source | Admitting: Psychiatry

## 2023-10-29 NOTE — Progress Notes (Signed)
 NO SHOW

## 2023-11-02 ENCOUNTER — Ambulatory Visit (INDEPENDENT_AMBULATORY_CARE_PROVIDER_SITE_OTHER): Payer: MEDICAID

## 2023-11-02 ENCOUNTER — Encounter (HOSPITAL_COMMUNITY): Payer: Self-pay

## 2023-11-02 VITALS — BP 121/89 | HR 68 | Ht 67.0 in | Wt 178.2 lb

## 2023-11-02 DIAGNOSIS — F250 Schizoaffective disorder, bipolar type: Secondary | ICD-10-CM | POA: Diagnosis not present

## 2023-11-02 NOTE — Progress Notes (Signed)
Pt came in for invega sustenna 156 mg/ml injection. Injection given in right deltoid. Pt had no complaints and tolerated injection well.

## 2023-11-04 ENCOUNTER — Other Ambulatory Visit: Payer: Self-pay | Admitting: Family Medicine

## 2023-11-23 ENCOUNTER — Telehealth (HOSPITAL_COMMUNITY): Payer: MEDICAID | Admitting: Psychiatry

## 2023-11-26 ENCOUNTER — Ambulatory Visit: Payer: MEDICAID | Admitting: Family Medicine

## 2023-12-01 ENCOUNTER — Telehealth (HOSPITAL_COMMUNITY): Payer: MEDICAID | Admitting: Psychiatry

## 2023-12-03 ENCOUNTER — Ambulatory Visit (INDEPENDENT_AMBULATORY_CARE_PROVIDER_SITE_OTHER): Payer: MEDICAID

## 2023-12-03 ENCOUNTER — Encounter (HOSPITAL_COMMUNITY): Payer: Self-pay | Admitting: Psychiatry

## 2023-12-03 VITALS — BP 127/85 | HR 83 | Ht 67.0 in | Wt 175.2 lb

## 2023-12-03 DIAGNOSIS — F250 Schizoaffective disorder, bipolar type: Secondary | ICD-10-CM | POA: Diagnosis not present

## 2023-12-03 NOTE — Progress Notes (Signed)
 Pt presented in office for invega sustenna 156 mg/ml injection. Pt had no chief complaints. Injection was given in left deltoid. Pt tolerated injection well.

## 2023-12-08 ENCOUNTER — Telehealth (HOSPITAL_COMMUNITY): Payer: MEDICAID | Admitting: Psychiatry

## 2023-12-15 ENCOUNTER — Encounter (HOSPITAL_COMMUNITY): Payer: Self-pay | Admitting: Psychiatry

## 2023-12-15 ENCOUNTER — Telehealth (INDEPENDENT_AMBULATORY_CARE_PROVIDER_SITE_OTHER): Payer: MEDICAID | Admitting: Psychiatry

## 2023-12-15 DIAGNOSIS — F5105 Insomnia due to other mental disorder: Secondary | ICD-10-CM | POA: Diagnosis not present

## 2023-12-15 DIAGNOSIS — F4001 Agoraphobia with panic disorder: Secondary | ICD-10-CM

## 2023-12-15 DIAGNOSIS — Z79899 Other long term (current) drug therapy: Secondary | ICD-10-CM

## 2023-12-15 DIAGNOSIS — F250 Schizoaffective disorder, bipolar type: Secondary | ICD-10-CM | POA: Diagnosis not present

## 2023-12-15 DIAGNOSIS — G8929 Other chronic pain: Secondary | ICD-10-CM

## 2023-12-15 DIAGNOSIS — S069X9A Unspecified intracranial injury with loss of consciousness of unspecified duration, initial encounter: Secondary | ICD-10-CM

## 2023-12-15 DIAGNOSIS — F4310 Post-traumatic stress disorder, unspecified: Secondary | ICD-10-CM

## 2023-12-15 DIAGNOSIS — F1411 Cocaine abuse, in remission: Secondary | ICD-10-CM

## 2023-12-15 DIAGNOSIS — F1191 Opioid use, unspecified, in remission: Secondary | ICD-10-CM

## 2023-12-15 DIAGNOSIS — F50814 Binge eating disorder, in remission: Secondary | ICD-10-CM

## 2023-12-15 DIAGNOSIS — F1211 Cannabis abuse, in remission: Secondary | ICD-10-CM

## 2023-12-15 MED ORDER — NALTREXONE HCL 50 MG PO TABS
100.0000 mg | ORAL_TABLET | Freq: Every day | ORAL | 2 refills | Status: DC
Start: 2023-12-15 — End: 2024-01-01

## 2023-12-15 MED ORDER — INVEGA SUSTENNA 156 MG/ML IM SUSY
156.0000 mg | PREFILLED_SYRINGE | INTRAMUSCULAR | 5 refills | Status: DC
Start: 2023-12-15 — End: 2024-02-09

## 2023-12-15 MED ORDER — NALTREXONE 380 MG IM SUSR
380.0000 mg | INTRAMUSCULAR | 5 refills | Status: DC
Start: 2023-12-15 — End: 2024-02-09

## 2023-12-15 MED ORDER — DIVALPROEX SODIUM ER 500 MG PO TB24: 500.0000 mg | ORAL_TABLET | Freq: Two times a day (BID) | ORAL | 2 refills | Status: DC

## 2023-12-15 MED ORDER — TRAZODONE HCL 50 MG PO TABS
50.0000 mg | ORAL_TABLET | Freq: Every day | ORAL | 2 refills | Status: DC
Start: 2023-12-15 — End: 2024-02-09

## 2023-12-15 MED ORDER — QUETIAPINE FUMARATE ER 200 MG PO TB24: 200.0000 mg | ORAL_TABLET | Freq: Every day | ORAL | 2 refills | Status: DC
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
## 2023-12-18 ENCOUNTER — Other Ambulatory Visit: Payer: Self-pay | Admitting: Family Medicine

## 2023-12-31 ENCOUNTER — Encounter (HOSPITAL_COMMUNITY): Payer: Self-pay

## 2023-12-31 ENCOUNTER — Ambulatory Visit (INDEPENDENT_AMBULATORY_CARE_PROVIDER_SITE_OTHER): Payer: MEDICAID

## 2023-12-31 VITALS — BP 128/91 | HR 86 | Ht 67.0 in | Wt 171.8 lb

## 2023-12-31 DIAGNOSIS — F250 Schizoaffective disorder, bipolar type: Secondary | ICD-10-CM | POA: Diagnosis not present

## 2023-12-31 DIAGNOSIS — F1191 Opioid use, unspecified, in remission: Secondary | ICD-10-CM

## 2023-12-31 NOTE — Progress Notes (Signed)
 Patient presented in office for Invega Sustenna 156 mg/ml injection. Injection given in pt's right deltoid. Pt tolerated injection well, no other chief complaints.

## 2024-01-01 MED ORDER — NALTREXONE 380 MG IM SUSR
380.0000 mg | INTRAMUSCULAR | Status: AC
Start: 2024-01-04 — End: 2024-06-01
  Administered 2024-01-04 – 2024-02-04 (×2): 380 mg via INTRAMUSCULAR

## 2024-01-01 NOTE — Addendum Note (Signed)
 Addended by: Tia Masker on: 01/01/2024 08:49 AM   Modules accepted: Orders

## 2024-01-04 ENCOUNTER — Ambulatory Visit (INDEPENDENT_AMBULATORY_CARE_PROVIDER_SITE_OTHER): Payer: MEDICAID | Admitting: *Deleted

## 2024-01-04 VITALS — BP 119/87 | HR 88

## 2024-01-04 DIAGNOSIS — F250 Schizoaffective disorder, bipolar type: Secondary | ICD-10-CM

## 2024-01-04 DIAGNOSIS — F1191 Opioid use, unspecified, in remission: Secondary | ICD-10-CM | POA: Diagnosis not present

## 2024-01-04 NOTE — Progress Notes (Signed)
 Patient came into office to get her new medication Vivitrol (naltrexone) 380mg . Patient did not complain after injection was given to her. Patient injection was given in the Left Upper Outer Quadrant. Patient sat in lobby for 15 mins after injection was given and she had no chief complaints. Patient walked out of office in sound mind.

## 2024-01-04 NOTE — Patient Instructions (Signed)
 Follow-up in 4 weeks

## 2024-01-08 ENCOUNTER — Encounter: Payer: Self-pay | Admitting: Family Medicine

## 2024-01-08 ENCOUNTER — Ambulatory Visit: Payer: MEDICAID | Admitting: Family Medicine

## 2024-01-08 VITALS — BP 97/68 | HR 80 | Ht 67.0 in | Wt 175.1 lb

## 2024-01-08 DIAGNOSIS — I10 Essential (primary) hypertension: Secondary | ICD-10-CM

## 2024-01-08 DIAGNOSIS — E559 Vitamin D deficiency, unspecified: Secondary | ICD-10-CM

## 2024-01-08 DIAGNOSIS — Z1211 Encounter for screening for malignant neoplasm of colon: Secondary | ICD-10-CM

## 2024-01-08 DIAGNOSIS — R7303 Prediabetes: Secondary | ICD-10-CM

## 2024-01-08 DIAGNOSIS — E038 Other specified hypothyroidism: Secondary | ICD-10-CM

## 2024-01-08 MED ORDER — TIZANIDINE HCL 6 MG PO CAPS: 6.0000 mg | ORAL_CAPSULE | Freq: Two times a day (BID) | ORAL | 2 refills | Status: AC | PRN

## 2024-01-08 MED ORDER — AMLODIPINE BESYLATE 2.5 MG PO TABS
2.5000 mg | ORAL_TABLET | Freq: Every day | ORAL | 2 refills | Status: DC
Start: 2024-01-08 — End: 2024-05-23

## 2024-01-08 MED ORDER — VITAMIN D3 25 MCG (1000 UT) PO CAPS: 1000.0000 [IU] | ORAL_CAPSULE | Freq: Every day | ORAL | 3 refills | Status: DC

## 2024-01-08 NOTE — Progress Notes (Signed)
 Established Patient Office Visit   Subjective  Patient ID: Paula Medina, female    DOB: Apr 25, 1976  Age: 48 y.o. 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  HPI Patient presents to the clinic for  chronic follow up. For the details of today's visit, please refer to assessment and plan.   Review of Systems  Constitutional:  Negative for chills and fever.  Eyes:  Negative for blurred vision.  Respiratory:  Negative for shortness of breath.   Cardiovascular:  Negative for chest pain.  Gastrointestinal:  Negative for abdominal pain.  Neurological:  Negative for dizziness and headaches.      Objective:     BP 97/68   Pulse 80   Ht 5\' 7"  (1.702 m)   Wt 175 lb 1.3 oz (79.4 kg)   LMP 10/07/2023 (Approximate)   SpO2 95%   BMI 27.42 kg/m  BP Readings from Last 3 Encounters:  01/08/24 97/68  07/23/23 117/84  06/29/23 (!) 130/90      Physical Exam Vitals reviewed.  Constitutional:      General: She is not in acute distress.    Appearance: Normal appearance. She is not ill-appearing, toxic-appearing or diaphoretic.  HENT:     Head: Normocephalic.  Eyes:     General:        Right eye: No discharge.        Left eye: No discharge.     Conjunctiva/sclera: Conjunctivae normal.  Cardiovascular:     Rate and Rhythm: Normal rate.     Pulses: Normal pulses.     Heart sounds: Normal heart sounds.  Pulmonary:     Effort: Pulmonary effort is normal. No respiratory distress.     Breath sounds: Normal breath sounds.  Abdominal:     General: Bowel sounds are normal.     Palpations: Abdomen is soft.     Tenderness: There is no abdominal tenderness. There is no right CVA tenderness, left  CVA tenderness or guarding.  Musculoskeletal:     Cervical back: Normal range of motion.  Skin:    General: Skin is warm and dry.     Capillary Refill: Capillary refill takes less than 2 seconds.  Neurological:     Mental Status: She is alert.     Coordination: Coordination normal.     Gait: Gait normal.  Psychiatric:        Mood and Affect: Mood normal.        Behavior: Behavior normal.      No results found for any visits on 01/08/24.  The 10-year ASCVD risk score (Arnett DK, et al., 2019) is: 1%    Assessment & Plan:  Primary hypertension Assessment & Plan: Vitals:   01/08/24 0931 01/08/24 1025  BP: 92/67 97/68  Over treating Start Amlodipine 2.5 mg ,D/c Amlodipine 10 mg Continue Losartan-hydrochlorothiazide Follow up in 2 weeks with at home blood pressure reading to monitor trends Labs ordered. Discussed with  patient to monitor their blood pressure regularly and maintain a heart-healthy diet rich in fruits, vegetables, whole grains, and low-fat dairy, while reducing sodium intake to less than 2,300 mg per day. Regular physical activity, such as 30 minutes of moderate exercise most days of  the week, will help lower blood pressure and improve overall cardiovascular health. Avoiding smoking, limiting alcohol consumption, and managing stress. Take  prescribed medication, & take it as directed and avoid skipping doses. Seek emergency care if your blood pressure is (over 180/100) or you experience chest pain, shortness of breath, or sudden vision changes.Patient verbalizes understanding regarding plan of care and all questions answered.   Orders: -     CMP14+EGFR -     CBC with Differential/Platelet -     Lipid panel  Screening for colon cancer -     Ambulatory referral to Gastroenterology  Prediabetes -     Hemoglobin A1c  TSH (thyroid-stimulating hormone deficiency) -     TSH + free T4  Vitamin D deficiency -     VITAMIN D 25 Hydroxy (Vit-D Deficiency,  Fractures)  Pre-diabetes Assessment & Plan: Last Hemoglobin A1c: 5.8 Labs: Ordered today, results pending; will follow up accordingly. Reviewed non-pharmacological interventions, including a balanced diet rich in lean proteins, healthy fats, whole grains, and high-fiber vegetables. Emphasized reducing refined sugars and processed carbohydrates, and incorporating more fruits, leafy greens, and legumes. Education: Patient was educated on recognizing signs and symptoms of both hypoglycemia and hyperglycemia, and advised to seek emergency care if these symptoms occur. Patient Understanding: The patient verbalized understanding of the care plan, and all questions were answered.    Other orders -     amLODIPine Besylate; Take 1 tablet (2.5 mg total) by mouth daily.  Dispense: 30 tablet; Refill: 2 -     tiZANidine HCl; Take 1 capsule (6 mg total) by mouth 2 (two) times daily as needed for muscle spasms.  Dispense: 60 capsule; Refill: 2 -     Vitamin D3; Take 1 capsule (1,000 Units total) by mouth daily.  Dispense: 90 capsule; Refill: 3    Return in about 6 months (around 07/09/2024), or if symptoms worsen or fail to improve, for hypertension, pre-diabetes.   Cruzita Lederer Newman Nip, FNP

## 2024-01-08 NOTE — Assessment & Plan Note (Signed)
 Last Hemoglobin A1c: 5.8 Labs: Ordered today, results pending; will follow up accordingly. Reviewed non-pharmacological interventions, including a balanced diet rich in lean proteins, healthy fats, whole grains, and high-fiber vegetables. Emphasized reducing refined sugars and processed carbohydrates, and incorporating more fruits, leafy greens, and legumes. Education: Patient was educated on recognizing signs and symptoms of both hypoglycemia and hyperglycemia, and advised to seek emergency care if these symptoms occur. Patient Understanding: The patient verbalized understanding of the care plan, and all questions were answered.

## 2024-01-08 NOTE — Assessment & Plan Note (Signed)
 Vitals:   01/08/24 0931 01/08/24 1025  BP: 92/67 97/68  Over treating Start Amlodipine 2.5 mg ,D/c Amlodipine 10 mg Continue Losartan-hydrochlorothiazide Follow up in 2 weeks with at home blood pressure reading to monitor trends Labs ordered. Discussed with  patient to monitor their blood pressure regularly and maintain a heart-healthy diet rich in fruits, vegetables, whole grains, and low-fat dairy, while reducing sodium intake to less than 2,300 mg per day. Regular physical activity, such as 30 minutes of moderate exercise most days of the week, will help lower blood pressure and improve overall cardiovascular health. Avoiding smoking, limiting alcohol consumption, and managing stress. Take  prescribed medication, & take it as directed and avoid skipping doses. Seek emergency care if your blood pressure is (over 180/100) or you experience chest pain, shortness of breath, or sudden vision changes.Patient verbalizes understanding regarding plan of care and all questions answered.

## 2024-01-08 NOTE — Patient Instructions (Addendum)
        Great to see you today.  I have refilled the medication(s) we provide.   Please take Vitamin D Cholecalciferol (VITAMIN D3) 25 MCG once daily  If labs were collected, we will inform you of lab results once received either by echart message or telephone call.   - echart message- for normal results that have been seen by the patient already.   - telephone call: abnormal results or if patient has not viewed results in their echart.   - Please take medications as prescribed. - Follow up with your primary health provider if any health concerns arises. - If symptoms worsen please contact your primary care provider and/or visit the emergency department.

## 2024-01-09 LAB — CMP14+EGFR
ALT: 6 IU/L (ref 0–32)
AST: 10 IU/L (ref 0–40)
Albumin: 3.9 g/dL (ref 3.9–4.9)
Alkaline Phosphatase: 53 IU/L (ref 44–121)
BUN/Creatinine Ratio: 11 (ref 9–23)
BUN: 10 mg/dL (ref 6–24)
Bilirubin Total: 0.3 mg/dL (ref 0.0–1.2)
CO2: 26 mmol/L (ref 20–29)
Calcium: 9.2 mg/dL (ref 8.7–10.2)
Chloride: 97 mmol/L (ref 96–106)
Creatinine, Ser: 0.92 mg/dL (ref 0.57–1.00)
Globulin, Total: 2.5 g/dL (ref 1.5–4.5)
Glucose: 104 mg/dL — ABNORMAL HIGH (ref 70–99)
Potassium: 3.1 mmol/L — ABNORMAL LOW (ref 3.5–5.2)
Sodium: 138 mmol/L (ref 134–144)
Total Protein: 6.4 g/dL (ref 6.0–8.5)
eGFR: 76 mL/min/{1.73_m2} (ref >59)

## 2024-01-09 LAB — CBC WITH DIFFERENTIAL/PLATELET
Basophils Absolute: 0 10*3/uL (ref 0.0–0.2)
Basos: 0 %
EOS (ABSOLUTE): 0.1 10*3/uL (ref 0.0–0.4)
Eos: 1 %
Hematocrit: 38.3 % (ref 34.0–46.6)
Hemoglobin: 12.9 g/dL (ref 11.1–15.9)
Immature Grans (Abs): 0 10*3/uL (ref 0.0–0.1)
Immature Granulocytes: 0 %
Lymphocytes Absolute: 2.8 10*3/uL (ref 0.7–3.1)
Lymphs: 37 %
MCH: 29.4 pg (ref 26.6–33.0)
MCHC: 33.7 g/dL (ref 31.5–35.7)
MCV: 87 fL (ref 79–97)
Monocytes Absolute: 0.7 10*3/uL (ref 0.1–0.9)
Monocytes: 10 %
Neutrophils Absolute: 3.9 10*3/uL (ref 1.4–7.0)
Neutrophils: 52 %
Platelets: 204 10*3/uL (ref 150–450)
RBC: 4.39 x10E6/uL (ref 3.77–5.28)
RDW: 12.6 % (ref 11.7–15.4)
WBC: 7.5 10*3/uL (ref 3.4–10.8)

## 2024-01-09 LAB — HEMOGLOBIN A1C
Est. average glucose Bld gHb Est-mCnc: 111 mg/dL
Hgb A1c MFr Bld: 5.5 % (ref 4.8–5.6)

## 2024-01-09 LAB — LIPID PANEL
Chol/HDL Ratio: 5.8 ratio — ABNORMAL HIGH (ref 0.0–4.4)
Cholesterol, Total: 186 mg/dL (ref 100–199)
HDL: 32 mg/dL — ABNORMAL LOW (ref >39)
LDL Chol Calc (NIH): 133 mg/dL — ABNORMAL HIGH (ref 0–99)
Triglycerides: 115 mg/dL (ref 0–149)
VLDL Cholesterol Cal: 21 mg/dL (ref 5–40)

## 2024-01-09 LAB — VITAMIN D 25 HYDROXY (VIT D DEFICIENCY, FRACTURES): Vit D, 25-Hydroxy: 10.3 ng/mL — ABNORMAL LOW (ref 30.0–100.0)

## 2024-01-09 LAB — TSH+FREE T4
Free T4: 0.9 ng/dL (ref 0.82–1.77)
TSH: 2.77 u[IU]/mL (ref 0.450–4.500)

## 2024-01-13 ENCOUNTER — Other Ambulatory Visit: Payer: Self-pay | Admitting: Family Medicine

## 2024-01-13 MED ORDER — VITAMIN D3 25 MCG (1000 UT) PO CAPS: 1000.0000 [IU] | ORAL_CAPSULE | Freq: Every day | ORAL | 1 refills | Status: AC

## 2024-01-13 MED ORDER — ROSUVASTATIN CALCIUM 10 MG PO TABS: 10.0000 mg | ORAL_TABLET | Freq: Every day | ORAL | 2 refills | Status: AC

## 2024-01-13 MED ORDER — POTASSIUM CHLORIDE CRYS ER 10 MEQ PO TBCR: 10.0000 meq | EXTENDED_RELEASE_TABLET | Freq: Every day | ORAL | 0 refills | Status: AC

## 2024-01-13 NOTE — Progress Notes (Signed)
 Please inform patient,  Potassium levels low- I sent in Potassium tablets to be taken once daily for 3 days.   Cholesterol levels elevated, start lifestyle modifications and follow diet low in saturated fat.  Plan: Crestor 10 mg once daily  Diet to Lower Cholesterol Eat More: Oats, beans, and lentils: High in soluble fiber. Fatty fish: Salmon, tuna (rich in omega-3s). Nuts and seeds: Almonds, walnuts, flaxseeds. Fruits and vegetables: Apples, berries, leafy greens. Healthy fats: Olive oil, avocado. Limit: Saturated fats: Butter, cream, fatty meats. Trans fats: Fried foods, processed snacks. Sugar and refined carbs: Sweets, white bread. Focus on whole foods, healthy fats, and fiber to improve heart health! Maintain an exercise routine 3 to 5 days a week for a minimum total of 150 minutes.     Vitamin D levels low, I advise to taking  over the counter supplements of vitamin D 1000 IU/day to prevent low vitamin D levels.  Consume Vitamin D-Rich Foods: Fatty Fish: Salmon, mackerel, tuna, and sardines. Egg Yolks: A good source if eaten whole. Fortified Foods: Milk, orange juice, cereals, and plant-based milks (like almond or soy milk). Mushrooms: Especially those exposed to sunlight or UV light. Cod Liver Oil: A concentrated source of vitamin D. Including these foods in your diet can help boost vitamin D levels   Symptoms of Low Vitamin D:  Fatigue and low energy. Bone pain and muscle weakness. Increased risk of fractures or weak bones. Frequent illness or infections. Mood changes, like depression or anxiety. Hair loss or thinning.

## 2024-01-21 ENCOUNTER — Encounter: Payer: Self-pay | Admitting: *Deleted

## 2024-01-26 ENCOUNTER — Telehealth: Payer: Self-pay | Admitting: Family Medicine

## 2024-01-26 NOTE — Telephone Encounter (Signed)
 Copied from CRM [id number]. Topic: General - Other >> Jan 25, 2024  4:47 PM [place] B wrote: Reason for CRM: pt called in, says her [employer] company has been trying to reach office, but can't get a response. She isnt sure what it's about and pt doesn't know the name of the company

## 2024-02-01 ENCOUNTER — Ambulatory Visit (INDEPENDENT_AMBULATORY_CARE_PROVIDER_SITE_OTHER): Payer: MEDICAID | Admitting: *Deleted

## 2024-02-01 VITALS — BP 127/90 | HR 71 | Ht 67.0 in | Wt 172.2 lb

## 2024-02-01 DIAGNOSIS — F250 Schizoaffective disorder, bipolar type: Secondary | ICD-10-CM | POA: Diagnosis not present

## 2024-02-01 NOTE — Patient Instructions (Signed)
 F/U in 4 weeeks

## 2024-02-01 NOTE — Progress Notes (Unsigned)
 Right

## 2024-02-04 ENCOUNTER — Ambulatory Visit (INDEPENDENT_AMBULATORY_CARE_PROVIDER_SITE_OTHER): Payer: MEDICAID | Admitting: *Deleted

## 2024-02-04 VITALS — BP 90/60 | HR 78 | Ht 67.0 in | Wt 170.2 lb

## 2024-02-04 DIAGNOSIS — F250 Schizoaffective disorder, bipolar type: Secondary | ICD-10-CM

## 2024-02-04 DIAGNOSIS — F1191 Opioid use, unspecified, in remission: Secondary | ICD-10-CM | POA: Diagnosis not present

## 2024-02-04 NOTE — Patient Instructions (Signed)
 Follow-up in 4 weeks

## 2024-02-04 NOTE — Progress Notes (Signed)
 Patient came into office to get her Vivitrol  380mg  injection. Patient B/P read at 87/60 and staff asked patient if she was lightheaded or dizzy and patient stated no for both. Injection was given in Right Upper Outer Quadrant. After injection, patient did not have any chief complaints. Patient walked out of office in sound mind.

## 2024-02-09 ENCOUNTER — Encounter (HOSPITAL_COMMUNITY): Payer: Self-pay | Admitting: Psychiatry

## 2024-02-09 ENCOUNTER — Telehealth (HOSPITAL_COMMUNITY): Payer: MEDICAID | Admitting: Psychiatry

## 2024-02-09 DIAGNOSIS — F250 Schizoaffective disorder, bipolar type: Secondary | ICD-10-CM | POA: Diagnosis not present

## 2024-02-09 DIAGNOSIS — E559 Vitamin D deficiency, unspecified: Secondary | ICD-10-CM

## 2024-02-09 DIAGNOSIS — F5105 Insomnia due to other mental disorder: Secondary | ICD-10-CM | POA: Diagnosis not present

## 2024-02-09 DIAGNOSIS — S069X9A Unspecified intracranial injury with loss of consciousness of unspecified duration, initial encounter: Secondary | ICD-10-CM

## 2024-02-09 DIAGNOSIS — M549 Dorsalgia, unspecified: Secondary | ICD-10-CM

## 2024-02-09 DIAGNOSIS — F4001 Agoraphobia with panic disorder: Secondary | ICD-10-CM

## 2024-02-09 DIAGNOSIS — F1211 Cannabis abuse, in remission: Secondary | ICD-10-CM

## 2024-02-09 DIAGNOSIS — Z79899 Other long term (current) drug therapy: Secondary | ICD-10-CM

## 2024-02-09 DIAGNOSIS — F1191 Opioid use, unspecified, in remission: Secondary | ICD-10-CM

## 2024-02-09 DIAGNOSIS — F1411 Cocaine abuse, in remission: Secondary | ICD-10-CM

## 2024-02-09 DIAGNOSIS — G8929 Other chronic pain: Secondary | ICD-10-CM

## 2024-02-09 MED ORDER — QUETIAPINE FUMARATE ER 200 MG PO TB24: 200.0000 mg | ORAL_TABLET | Freq: Every day | ORAL | 5 refills | Status: AC

## 2024-02-09 MED ORDER — TRAZODONE HCL 50 MG PO TABS: 50.0000 mg | ORAL_TABLET | Freq: Every day | ORAL | 5 refills | Status: DC

## 2024-02-09 MED ORDER — INVEGA SUSTENNA 156 MG/ML IM SUSY: 156.0000 mg | PREFILLED_SYRINGE | INTRAMUSCULAR | 5 refills | Status: DC

## 2024-02-09 MED ORDER — NALTREXONE 380 MG IM SUSR: 380.0000 mg | INTRAMUSCULAR | 5 refills | Status: DC

## 2024-02-09 MED ORDER — DIVALPROEX SODIUM ER 500 MG PO TB24: 500.0000 mg | ORAL_TABLET | Freq: Two times a day (BID) | ORAL | 5 refills | Status: DC
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
## 2024-02-09 NOTE — Patient Instructions (Signed)
 We did not make any medication changes today.  I will message her primary care doctor about getting updated lab work for your Seroquel  and Depakote  as well as helping to figure out why your appetite is still low.

## 2024-03-03 ENCOUNTER — Telehealth: Payer: Self-pay | Admitting: Family Medicine

## 2024-03-03 ENCOUNTER — Ambulatory Visit (HOSPITAL_COMMUNITY): Payer: MEDICAID | Admitting: Registered Nurse

## 2024-03-03 DIAGNOSIS — Z0279 Encounter for issue of other medical certificate: Secondary | ICD-10-CM

## 2024-03-03 NOTE — Telephone Encounter (Signed)
 Placed in "to fax" tray

## 2024-03-03 NOTE — Telephone Encounter (Signed)
 [facility] forms  Noted Copied Sleeved  Original forms placed in pcp box  Copy form at front desk folder  Call [phone] when forms are completed.

## 2024-03-04 NOTE — Telephone Encounter (Signed)
 Faxed to [phone] on 5/29 5:02PM with confirmation

## 2024-03-05 ENCOUNTER — Other Ambulatory Visit: Payer: Self-pay | Admitting: Family Medicine

## 2024-03-07 ENCOUNTER — Ambulatory Visit (INDEPENDENT_AMBULATORY_CARE_PROVIDER_SITE_OTHER): Payer: MEDICAID | Admitting: *Deleted

## 2024-03-07 VITALS — BP 101/71 | HR 77 | Ht 67.0 in | Wt 166.4 lb

## 2024-03-07 DIAGNOSIS — F250 Schizoaffective disorder, bipolar type: Secondary | ICD-10-CM | POA: Diagnosis not present

## 2024-03-07 MED ORDER — PALIPERIDONE PALMITATE ER 156 MG/ML IM SUSY
156.0000 mg | PREFILLED_SYRINGE | INTRAMUSCULAR | Status: AC
  Administered 2024-03-07 – 2024-06-27 (×4): 156 mg via INTRAMUSCULAR

## 2024-03-07 MED ORDER — NALTREXONE 380 MG IM SUSR
380.0000 mg | INTRAMUSCULAR | Status: AC
  Administered 2024-04-11 – 2024-06-27 (×3): 380 mg via INTRAMUSCULAR

## 2024-03-07 NOTE — Progress Notes (Cosign Needed Addendum)
 No C/C, patient was a bit happy today due to her son capping was yesterday and he graduates this coming Friday. Patient stayed for observation and did not have any c/c.   Injection was given in patient Left Deltoid  Invega  Sustenna 156mg /mL LOT NUMBER: [id number] Expiration: May 2026 NDC: 16109-604-54

## 2024-03-07 NOTE — Telephone Encounter (Signed)
 Patient came by office, forms were printed from media and given to patient.

## 2024-03-07 NOTE — Patient Instructions (Signed)
 Follow-up in 4 weeks

## 2024-03-31 ENCOUNTER — Ambulatory Visit (INDEPENDENT_AMBULATORY_CARE_PROVIDER_SITE_OTHER): Payer: MEDICAID | Admitting: Registered Nurse

## 2024-03-31 ENCOUNTER — Encounter (HOSPITAL_COMMUNITY): Payer: Self-pay | Admitting: Registered Nurse

## 2024-03-31 ENCOUNTER — Other Ambulatory Visit: Payer: Self-pay

## 2024-03-31 DIAGNOSIS — F250 Schizoaffective disorder, bipolar type: Secondary | ICD-10-CM

## 2024-03-31 DIAGNOSIS — F99 Mental disorder, not otherwise specified: Secondary | ICD-10-CM

## 2024-03-31 DIAGNOSIS — F4001 Agoraphobia with panic disorder: Secondary | ICD-10-CM | POA: Diagnosis not present

## 2024-03-31 DIAGNOSIS — F5105 Insomnia due to other mental disorder: Secondary | ICD-10-CM | POA: Diagnosis not present

## 2024-03-31 DIAGNOSIS — F1910 Other psychoactive substance abuse, uncomplicated: Secondary | ICD-10-CM

## 2024-03-31 DIAGNOSIS — Z79899 Other long term (current) drug therapy: Secondary | ICD-10-CM

## 2024-03-31 MED ORDER — DIVALPROEX SODIUM ER 500 MG PO TB24: 500.0000 mg | ORAL_TABLET | Freq: Two times a day (BID) | ORAL | 3 refills | Status: AC

## 2024-03-31 MED ORDER — NALTREXONE 380 MG IM SUSR: 380.0000 mg | INTRAMUSCULAR | 3 refills | Status: AC

## 2024-03-31 MED ORDER — TRAZODONE HCL 100 MG PO TABS: 50.0000 mg | ORAL_TABLET | Freq: Every day | ORAL | 3 refills | Status: AC

## 2024-03-31 MED ORDER — INVEGA SUSTENNA 156 MG/ML IM SUSY: 156.0000 mg | PREFILLED_SYRINGE | INTRAMUSCULAR | 3 refills | Status: AC

## 2024-03-31 NOTE — Patient Instructions (Addendum)
 Talk to your PCP for referral for a sleep study and nutritional consult related to decreased appetite and wt. Loss of 20 pounds in last 2 months; and difficulty sleeping to rule out any other factors that may be the cause for either.  Call 911, [phone], mobile crisis, or present to the nearest emergency room should you experience any suicidal/homicidal ideation, auditory/visual/hallucinations, or detrimental worsening of your mental health.  Mobile Crisis Response Teams Listed by counties in vicinity of [facility] providers [facility] [phone] [facility] [phone] [facility] [phone] [facility] [phone] [place]                * [facility] [phone]                * [facility] [phone]  [facility] [phone] [facility] [employer]  [phone] * [facility] [phone]       Labs have been ordered since it has been a while since you had any.  Labs are checked at least yearly and more frequently depending on prescribed medication.  Routine blood work is an important part of assessing a patient's overall health and to rule out any condition that is not psychiatric related.  Tests such as a complete blood count, metabolic panel, lipid panel, thyroid function tests, and hemoglobin A1c test (screening for diabetes) can help a psychiatrist understand the general medical health of a patient.  Blood work can help make informed decisions about a potential differential diagnosis and help understand other factors that may be the correct reason for the presentation.  In some cases, blood work can aid in the diagnosis, medication management, and/or treatment of your a mental health. Please have labs drawn prior to next scheduled visit.    Routine Labs checked at least yearly.  May need to be checked more often depending on prescribed medications.   CBC with Differential/Platelet    Comprehensive metabolic panel    Hemoglobin A1c    Magnesium    Ethanol    Urinalysis, Routine w reflex microscopic Urine, Clean Catch    Pregnancy, urine (females of child bearing age) POCT Urine Drug Screen:  If prescribed any controlled substance  Also recommend EKG prior to starting psychotropic medications as a baseline and periodically after starting psychotropic medications to monitor for prolong QTc which can indicate the presence of cardiac risk factors.  Studies have shown that some psychotropic medications can increase the risk of prolong QTc.  Please have your primary care provider to do EKG at your next scheduled visit and send results if not available on Epic.
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
## 2024-04-07 ENCOUNTER — Ambulatory Visit (HOSPITAL_COMMUNITY): Payer: MEDICAID | Admitting: *Deleted

## 2024-04-07 ENCOUNTER — Telehealth (HOSPITAL_COMMUNITY): Payer: Self-pay | Admitting: *Deleted

## 2024-04-07 VITALS — BP 114/83 | HR 80 | Ht 67.0 in | Wt 160.6 lb

## 2024-04-07 DIAGNOSIS — F250 Schizoaffective disorder, bipolar type: Secondary | ICD-10-CM

## 2024-04-07 DIAGNOSIS — F1910 Other psychoactive substance abuse, uncomplicated: Secondary | ICD-10-CM

## 2024-04-07 NOTE — Patient Instructions (Signed)
 Follow-up in 4 weeks

## 2024-04-07 NOTE — Telephone Encounter (Signed)
 Patient called and informed to come into office to get injection on July 7th and she verbalized understanding and agreed.

## 2024-04-07 NOTE — Telephone Encounter (Signed)
 Patient came into office to get her Invega  injection wasn't asked about her other injection. Per pt will she be restarting it. Per pt provider did not tell her if she was going to continue medication (Naltrexone , Vivitrol  injection) or not. Per pt she is having dreams again about drugs. Staff informed patient that she will get advise from provider and call her back with information.    Per verbal from provider, patient will not be reinstating medication(Naltrexone  injection). Staff was not instructed by provider to reinstate patient Naltrexone  injection after initial appointment. Staff was instructed via secure chat after the initial appointment to inform patient to f/u with her PCP about her sleep study, continue her Invega , f/u in 3 months and make sure she gets her labs done. After patient asked staff about restarting her Naltrexone  injection, staff went into chart to read notes to see if patient was instructed to restart medication.    Please advise if patient is continuing injection for Naltrexone  380mg  every 30 days. Patient last injection was given

## 2024-04-07 NOTE — Progress Notes (Cosign Needed)
 Patient came in with no C/C from previous injection of her Invega  156mg /mL. Injection for patient Invega  156/mg/mL was given today in the Right Deltoid. Patient had no C/C and waited for observation. No C/C.   When vitals were being completed, patient first Blood Pressure was 123/91. Staff asked patient if she took her BP meds this morning. Patient then stated that she did not due to having to go get her labs done this morning. Vitals were took again and it was 114/83.   Per pt she would like to know if she will go back on her vivitrol  again. Per pt provider did tell her anything about restarting the medication. Per pt she is having dreams about drugs again. Staff informed patient that message will be sent to provider and ask and inform her with this information. Message will be sent provider for advise.

## 2024-04-08 LAB — CBC WITH DIFFERENTIAL/PLATELET
Absolute Lymphocytes: 2196 {cells}/uL (ref 850–3900)
Absolute Monocytes: 726 {cells}/uL (ref 200–950)
Basophils Absolute: 42 {cells}/uL (ref 0–200)
Basophils Relative: 0.7 %
Eosinophils Absolute: 42 {cells}/uL (ref 15–500)
Eosinophils Relative: 0.7 %
HCT: 39.9 % (ref 35.0–45.0)
Hemoglobin: 13 g/dL (ref 11.7–15.5)
MCH: 28.8 pg (ref 27.0–33.0)
MCHC: 32.6 g/dL (ref 32.0–36.0)
MCV: 88.3 fL (ref 80.0–100.0)
MPV: 11.6 fL (ref 7.5–12.5)
Monocytes Relative: 12.1 %
Neutro Abs: 2994 {cells}/uL (ref 1500–7800)
Neutrophils Relative %: 49.9 %
Platelets: 184 Thousand/uL (ref 140–400)
RBC: 4.52 Million/uL (ref 3.80–5.10)
RDW: 14 % (ref 11.0–15.0)
Total Lymphocyte: 36.6 %
WBC: 6 Thousand/uL (ref 3.8–10.8)

## 2024-04-08 LAB — COMPREHENSIVE METABOLIC PANEL WITH GFR
AG Ratio: 1.7 (calc) (ref 1.0–2.5)
ALT: 5 U/L — ABNORMAL LOW (ref 6–29)
AST: 9 U/L — ABNORMAL LOW (ref 10–35)
Albumin: 4.5 g/dL (ref 3.6–5.1)
Alkaline phosphatase (APISO): 42 U/L (ref 31–125)
BUN: 10 mg/dL (ref 7–25)
CO2: 28 mmol/L (ref 20–32)
Calcium: 9.6 mg/dL (ref 8.6–10.2)
Chloride: 104 mmol/L (ref 98–110)
Creat: 0.85 mg/dL (ref 0.50–0.99)
Globulin: 2.6 g/dL (ref 1.9–3.7)
Glucose, Bld: 99 mg/dL (ref 65–99)
Potassium: 3.6 mmol/L (ref 3.5–5.3)
Sodium: 142 mmol/L (ref 135–146)
Total Bilirubin: 1 mg/dL (ref 0.2–1.2)
Total Protein: 7.1 g/dL (ref 6.1–8.1)
eGFR: 84 mL/min/1.73m2 (ref >=60)

## 2024-04-08 LAB — MAGNESIUM: Magnesium: 2 mg/dL (ref 1.5–2.5)

## 2024-04-08 LAB — TSH: TSH: 3.89 m[IU]/L

## 2024-04-08 LAB — VALPROIC ACID LEVEL: Valproic Acid Lvl: <12.5 mg/L — ABNORMAL LOW (ref 50.0–100.0)

## 2024-04-08 LAB — PROLACTIN: Prolactin: 60.7 ng/mL — ABNORMAL HIGH

## 2024-04-11 ENCOUNTER — Ambulatory Visit (HOSPITAL_COMMUNITY): Payer: MEDICAID | Admitting: *Deleted

## 2024-04-11 VITALS — BP 121/90 | HR 88 | Ht 67.0 in | Wt 163.0 lb

## 2024-04-11 DIAGNOSIS — F1910 Other psychoactive substance abuse, uncomplicated: Secondary | ICD-10-CM

## 2024-04-11 DIAGNOSIS — F250 Schizoaffective disorder, bipolar type: Secondary | ICD-10-CM

## 2024-04-11 NOTE — Patient Instructions (Signed)
 Follow-up in 4 weeks

## 2024-04-11 NOTE — Progress Notes (Cosign Needed)
 Patient came into office to get her Vivitrol  injection. Patient did not have any c/c. Patient received injection in her Left Upper Outer Quadrant. Patient walked out of office in sound mind.

## 2024-05-05 ENCOUNTER — Encounter (HOSPITAL_COMMUNITY): Payer: Self-pay

## 2024-05-05 ENCOUNTER — Telehealth (HOSPITAL_COMMUNITY): Payer: Self-pay

## 2024-05-05 ENCOUNTER — Ambulatory Visit (HOSPITAL_COMMUNITY): Payer: MEDICAID

## 2024-05-05 NOTE — Telephone Encounter (Signed)
 Medication management - Patient came in today with Mother for due Invega  Sustenna 156 mg IM q 28 day injection, however, pateint brought in her Vivitrol  injection instead. Called patient's [facility] and informed them patient needed her Invega  Sustenna filled.  Pharmacy had to order the medication and stated this would be in on tomorrow.  Patient rescheduled to return for due injection on Monday 05/09/24 when her Vivitrol  order will be due then too.  Patient and her Mother stated understanding plan and will call if any issues getting medication filled.

## 2024-05-09 ENCOUNTER — Encounter (HOSPITAL_COMMUNITY): Payer: Self-pay

## 2024-05-09 ENCOUNTER — Ambulatory Visit (INDEPENDENT_AMBULATORY_CARE_PROVIDER_SITE_OTHER): Payer: MEDICAID

## 2024-05-09 VITALS — BP 141/93 | HR 75 | Temp 97.6°F | Ht 67.0 in | Wt 159.0 lb

## 2024-05-09 DIAGNOSIS — F1191 Opioid use, unspecified, in remission: Secondary | ICD-10-CM

## 2024-05-09 DIAGNOSIS — F411 Generalized anxiety disorder: Secondary | ICD-10-CM | POA: Diagnosis not present

## 2024-05-09 DIAGNOSIS — F250 Schizoaffective disorder, bipolar type: Secondary | ICD-10-CM | POA: Diagnosis not present

## 2024-05-09 DIAGNOSIS — Z79899 Other long term (current) drug therapy: Secondary | ICD-10-CM

## 2024-05-09 DIAGNOSIS — F50814 Binge eating disorder, in remission: Secondary | ICD-10-CM | POA: Diagnosis not present

## 2024-05-09 DIAGNOSIS — F4310 Post-traumatic stress disorder, unspecified: Secondary | ICD-10-CM

## 2024-05-09 DIAGNOSIS — F4001 Agoraphobia with panic disorder: Secondary | ICD-10-CM

## 2024-05-09 DIAGNOSIS — F1411 Cocaine abuse, in remission: Secondary | ICD-10-CM

## 2024-05-09 DIAGNOSIS — F1211 Cannabis abuse, in remission: Secondary | ICD-10-CM

## 2024-05-09 NOTE — Progress Notes (Cosign Needed)
 Pt came into office today to have her invega  sustenna 156mg /ml and the vivitrol  380mg  given. Pt appeared clean and stated that she had no thoughts of hurting herself or others. Pt blood pressure was elevated. Pt was given the signs and symptoms of heart attack and stroke signs. Pt was advised that if she had any of those symptoms to go to the ER.  Pt understood.  Invega  sustenna 156mg /ml was prepared and given in the right deltoid. Pt tolerated injection well. NDC# 49541-436-98 Lot# [id number] EXP: 05-06-2025  S/N# [id number]  GTTN#: [id number].    The vivitrol  was prepared and given in the right upper outer quadrant. Pt tolerated injection well.  3.85ml of the diluent vial was injected into the vivitrol  vial.  Vial was then shaken. 4.60ml was withdrawn and given to patient in the rt upper outer quadrant. Pt tolerated injection well.  Pt to return in 30 days for both injections.

## 2024-05-09 NOTE — Patient Instructions (Signed)
 Pt came into office today to have her invega  sustenna 156mg /ml and the vivitrol  380mg  given. Pt appeared clean and stated that she had no thoughts of hurting herself or others. Pt blood pressure was elevated. Pt was given the signs and symptoms of heart attack and stroke signs. Pt was advised that if she had any of those symptoms to go to the ER.  Pt understood.  Invega  sustenna 156mg /ml was prepared and given in the right deltoid. Pt tolerated injection well. NDC# 49541-436-98 Lot# [id number] EXP: 05-06-2025  S/N# [id number]  GTTN#: [id number].    The vivitrol  was prepared and given in the right upper outer quadrant. Pt tolerated injection well.  3.85ml of the diluent vial was injected into the vivitrol  vial.  Vial was then shaken. 4.60ml was withdrawn and given to patient in the rt upper outer quadrant. Pt tolerated injection well.  Pt to return in 30 days for both injections.

## 2024-05-22 ENCOUNTER — Other Ambulatory Visit: Payer: Self-pay | Admitting: Family Medicine

## 2024-05-23 ENCOUNTER — Ambulatory Visit (HOSPITAL_COMMUNITY): Payer: MEDICAID | Admitting: Registered Nurse

## 2024-06-02 ENCOUNTER — Ambulatory Visit (HOSPITAL_COMMUNITY): Payer: MEDICAID | Admitting: Registered Nurse

## 2024-06-07 ENCOUNTER — Telehealth: Payer: Self-pay

## 2024-06-07 ENCOUNTER — Other Ambulatory Visit: Payer: Self-pay

## 2024-06-07 ENCOUNTER — Ambulatory Visit (HOSPITAL_COMMUNITY): Payer: MEDICAID | Admitting: Psychiatry

## 2024-06-07 NOTE — Telephone Encounter (Signed)
 Called pt t advise her that she gets her injections from [facility], not our office.

## 2024-06-07 NOTE — Telephone Encounter (Signed)
 Left message to call back   Reason for call documented in pts chart

## 2024-06-07 NOTE — Telephone Encounter (Signed)
 Copied from CRM [id number]. Topic: Clinical - Prescription Issue >> Jun 07, 2024  8:29 AM Paula Medina wrote: Reason for CRM: Pt called reporting that she went to the pharmacy and they told her they do not have her injections. Please advise. Says she is due for her injections today.

## 2024-06-13 ENCOUNTER — Telehealth (HOSPITAL_COMMUNITY): Payer: Self-pay | Admitting: *Deleted

## 2024-06-13 NOTE — Telephone Encounter (Signed)
 Called patient to resch appt fot 06-30-24 to another day due to provider will not be in office for that appt. Staff was not able to reach patient and lmom and office number was provided on voicemail.

## 2024-06-24 ENCOUNTER — Other Ambulatory Visit: Payer: Self-pay | Admitting: Family Medicine

## 2024-06-24 DIAGNOSIS — G8929 Other chronic pain: Secondary | ICD-10-CM

## 2024-06-27 ENCOUNTER — Encounter (HOSPITAL_COMMUNITY): Payer: Self-pay

## 2024-06-27 ENCOUNTER — Ambulatory Visit (HOSPITAL_COMMUNITY): Payer: MEDICAID

## 2024-06-27 VITALS — BP 137/88 | HR 78 | Ht 67.0 in | Wt 153.6 lb

## 2024-06-27 DIAGNOSIS — F250 Schizoaffective disorder, bipolar type: Secondary | ICD-10-CM | POA: Diagnosis not present

## 2024-06-27 NOTE — Progress Notes (Signed)
 Pt came in for Invega  Sustenna (paliperidone  Palmitate) 156 MG/ML injection. Injection prepared and given in right deltoid. Pt tolerated injection well and had no chief complaint. WIR:[id number] Lot: [id number] Exp: 06/05/2025   Pt came in for Vivitrol  (naltrexone ) injection. Injection prepared and given in right upper outter quadrant. 4.2 ml withdrawn and given. Pt tolerated injection well and had no chief complaint. NDC: 34242-697-97 Lot: [id number] Exp: 10/05/2026

## 2024-06-30 ENCOUNTER — Telehealth (HOSPITAL_COMMUNITY): Payer: MEDICAID | Admitting: Registered Nurse

## 2024-07-13 ENCOUNTER — Ambulatory Visit: Payer: Self-pay | Admitting: Family Medicine

## 2024-07-21 ENCOUNTER — Telehealth (HOSPITAL_COMMUNITY): Payer: MEDICAID | Admitting: Registered Nurse

## 2024-07-26 ENCOUNTER — Encounter (INDEPENDENT_AMBULATORY_CARE_PROVIDER_SITE_OTHER): Payer: Self-pay | Admitting: *Deleted

## 2024-07-28 ENCOUNTER — Ambulatory Visit (HOSPITAL_COMMUNITY): Payer: MEDICAID | Admitting: Registered Nurse

## 2024-09-09 ENCOUNTER — Other Ambulatory Visit: Payer: Self-pay | Admitting: Family Medicine

## 2024-10-24 ENCOUNTER — Ambulatory Visit (HOSPITAL_COMMUNITY): Payer: MEDICAID | Admitting: Registered Nurse

## 2024-10-31 ENCOUNTER — Ambulatory Visit (HOSPITAL_COMMUNITY): Payer: MEDICAID | Admitting: Registered Nurse

## 2024-11-14 ENCOUNTER — Ambulatory Visit (HOSPITAL_COMMUNITY): Payer: MEDICAID | Admitting: Registered Nurse
